# Patient Record
Sex: Female | Born: 2000 | Race: White | Hispanic: No | Marital: Married
Health system: Midwestern US, Community
[De-identification: ages and names within clinical notes are randomized; demographics above are authoritative.]

## PROBLEM LIST (undated history)

## (undated) DIAGNOSIS — Z309 Encounter for contraceptive management, unspecified: Secondary | ICD-10-CM

## (undated) DIAGNOSIS — M79671 Pain in right foot: Principal | ICD-10-CM

## (undated) DIAGNOSIS — G4733 Obstructive sleep apnea (adult) (pediatric): Principal | ICD-10-CM

## (undated) DIAGNOSIS — B2700 Gammaherpesviral mononucleosis without complication: Principal | ICD-10-CM

## (undated) DIAGNOSIS — G8929 Other chronic pain: Secondary | ICD-10-CM

## (undated) DIAGNOSIS — M79672 Pain in left foot: Secondary | ICD-10-CM

## (undated) DIAGNOSIS — L301 Dyshidrosis [pompholyx]: Secondary | ICD-10-CM

---
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
## 2019-09-29 ENCOUNTER — Other Ambulatory Visit: Payer: Self-pay | Admitting: Nurse Practitioner

## 2019-09-29 DIAGNOSIS — N946 Dysmenorrhea, unspecified: Secondary | ICD-10-CM

## 2019-12-19 ENCOUNTER — Other Ambulatory Visit: Payer: Self-pay | Admitting: Nurse Practitioner

## 2019-12-19 DIAGNOSIS — N946 Dysmenorrhea, unspecified: Secondary | ICD-10-CM

## 2019-12-19 MED ORDER — NORETHIN ACE-ETH ESTRAD-FE 1.5-30 MG-MCG PO TABS: ORAL_TABLET | ORAL | 0 refills | Status: DC

## 2019-12-19 NOTE — Telephone Encounter (Signed)
  Prescription Request  12/19/2019  What is the name of the medication or equipment? JUNEL FE 1.5/30 1.5-30 MG-MCG tablet   Have you contacted your pharmacy to request a refill? (if applicable) no-pt states she has lost her birth control and needing it replaced  Which pharmacy would you like this sent to? [facility] in [place]   Patient notified that their request is being sent to the clinical staff for review and that they should receive a response within 2 business days.

## 2019-12-19 NOTE — Telephone Encounter (Signed)
One ref[facility] in [place], appt made for 12/26/19

## 2019-12-26 ENCOUNTER — Encounter: Payer: Self-pay | Admitting: Nurse Practitioner

## 2019-12-26 ENCOUNTER — Telehealth (INDEPENDENT_AMBULATORY_CARE_PROVIDER_SITE_OTHER): Payer: Managed Care, Other (non HMO) | Admitting: Nurse Practitioner

## 2019-12-26 DIAGNOSIS — Z3041 Encounter for surveillance of contraceptive pills: Secondary | ICD-10-CM | POA: Diagnosis not present

## 2019-12-26 MED ORDER — NORETHIN ACE-ETH ESTRAD-FE 1.5-30 MG-MCG PO TABS: ORAL_TABLET | ORAL | 1 refills | Status: DC

## 2019-12-26 NOTE — Progress Notes (Signed)
   Virtual Visit via video Note   Due to COVID-19 pandemic this visit was conducted virtually. This visit type was conducted due to national recommendations for restrictions regarding the COVID-19 Pandemic (e.g. social distancing, sheltering in place) in an effort to limit this patient's exposure and mitigate transmission in our community. All issues noted in this document were discussed and addressed.  A physical exam was not performed with this format.  I connected with  [name]  on 12/26/19 at 11:45 by video and verified that I am speaking with the correct person using two identifiers. Mariah Huffman is currently located at HOME and NO ONE is currently with HER during visit. The provider, Mary-Margaret Daphine Deutscher, FNP is located in their office at time of visit.  I discussed the limitations, risks, security and privacy concerns of performing an evaluation and management service by video  and the availability of in person appointments. I also discussed with the patient that there may be a patient responsible charge related to this service. The patient expressed understanding and agreed to proceed.   History and Present Illness:   Chief Complaint: Contraception   HPI Patient has appointment today needing refill of birth control pills. She has been on them for about 2 years now and is doing well. LMP was 12/08/19 and was normal. She denies any medictaion side effcts.    Review of Systems  Constitutional: Negative for diaphoresis and weight loss.  Eyes: Negative for blurred vision, double vision and pain.  Respiratory: Negative for shortness of breath.   Cardiovascular: Negative for chest pain, palpitations, orthopnea and leg swelling.  Gastrointestinal: Negative for abdominal pain.  Skin: Negative for rash.  Neurological: Negative for dizziness, sensory change, loss of consciousness, weakness and headaches.  Endo/Heme/Allergies: Negative for polydipsia. Does not  bruise/bleed easily.  Psychiatric/Behavioral: Negative for memory loss. The patient does not have insomnia.   All other systems reviewed and are negative.      Observations/Objective: Alert and oriented- answers all questions appropriately No distress    Assessment and Plan: Mariah Huffman in today with chief complaint of Contraception   1. Encounter for birth control pills maintenance Allowed time for questions Will need PAP when turns 20  - norethindrone-ethinyl estradiol-iron (JUNEL FE 1.5/30) 1.5-30 MG-MCG tablet; TAKE 1 TABLET BY MOUTH DAILY. (NEEDS TO BE SEEN BEFORE NEXT REFILL)  Dispense: 84 tablet; Refill: 1    Follow Up Instructions: 1 year    I discussed the assessment and treatment plan with the patient. The patient was provided an opportunity to ask questions and all were answered. The patient agreed with the plan and demonstrated an understanding of the instructions.   The patient was advised to call back or seek an in-person evaluation if the symptoms worsen or if the condition fails to improve as anticipated.  The above assessment and management plan was discussed with the patient. The patient verbalized understanding of and has agreed to the management plan. Patient is aware to call the clinic if symptoms persist or worsen. Patient is aware when to return to the clinic for a follow-up visit. Patient educated on when it is appropriate to go to the emergency department.   Time call ended: 12:00 I provided 15 minutes of face-to-face time during this encounter.    Mary-Margaret Daphine Deutscher, FNP

## 2020-09-14 ENCOUNTER — Other Ambulatory Visit: Payer: Self-pay | Admitting: Nurse Practitioner

## 2020-09-14 DIAGNOSIS — Z3041 Encounter for surveillance of contraceptive pills: Secondary | ICD-10-CM

## 2020-10-08 ENCOUNTER — Telehealth: Payer: Self-pay | Admitting: Nurse Practitioner

## 2020-10-08 NOTE — Telephone Encounter (Signed)
Pt called - immun. Reviewed with pt

## 2020-12-09 ENCOUNTER — Encounter: Payer: Self-pay | Admitting: Nurse Practitioner

## 2020-12-09 ENCOUNTER — Ambulatory Visit (INDEPENDENT_AMBULATORY_CARE_PROVIDER_SITE_OTHER): Payer: Managed Care, Other (non HMO) | Admitting: Nurse Practitioner

## 2020-12-09 DIAGNOSIS — F32A Depression, unspecified: Secondary | ICD-10-CM | POA: Diagnosis not present

## 2020-12-09 DIAGNOSIS — F419 Anxiety disorder, unspecified: Secondary | ICD-10-CM

## 2020-12-09 MED ORDER — CITALOPRAM HYDROBROMIDE 20 MG PO TABS: 20.0000 mg | ORAL_TABLET | Freq: Every day | ORAL | 5 refills | Status: DC

## 2020-12-09 NOTE — Progress Notes (Signed)
Virtual Visit  Note Due to COVID-19 pandemic this visit was conducted virtually. This visit type was conducted due to national recommendations for restrictions regarding the COVID-19 Pandemic (e.g. social distancing, sheltering in place) in an effort to limit this patient's exposure and mitigate transmission in our community. All issues noted in this document were discussed and addressed.  A physical exam was not performed with this format.  I connected with Mariah Huffman on 12/09/20 at 8:27 by telephone and verified that I am speaking with the correct person using two identifiers. Mariah Huffman is currently located at college and no one is currently with her during visit. The provider, Mary-Margaret Daphine Deutscher, FNP is located in their office at time of visit.  I discussed the limitations, risks, security and privacy concerns of performing an evaluation and management service by telephone and the availability of in person appointments. I also discussed with the patient that there may be a patient responsible charge related to this service. The patient expressed understanding and agreed to proceed.   History and Present Illness:  Patient is a Consulting civil engineer at [employer]. She started having anxiety being around crowds and test taking. She has been seeing a counselor for over a year. Now she says she is anxious all the time. Counselor suggested she start on anti anxiety medication. GAD 7 : Generalized Anxiety Score 12/09/2020  Nervous, Anxious, on Edge 3  Control/stop worrying 2  Worry too much - different things 2  Trouble relaxing 2  Restless 3  Easily annoyed or irritable 2  Afraid - awful might happen 1  Total GAD 7 Score 15  Anxiety Difficulty Very difficult    Depression screen [facility] 2/9 12/09/2020 11/20/2017 09/17/2017  Decreased Interest 1 0 0  Down, Depressed, Hopeless 1 0 0  PHQ - 2 Score 2 0 0  Altered sleeping 2 - -  Tired, decreased energy 1 - -  Change in appetite 0 - -   Feeling bad or failure about yourself  2 - -  Trouble concentrating 2 - -  Moving slowly or fidgety/restless 1 - -  Suicidal thoughts 1 - -  PHQ-9 Score 11 - -  Difficult doing work/chores Very difficult - -      Review of Systems  Psychiatric/Behavioral:  Positive for depression. Negative for substance abuse. The patient is nervous/anxious.   All other systems reviewed and are negative.   Observations/Objective: Alert and oriented- answers all questions appropriately No distress   Assessment and Plan: Mariah Huffman in today with chief complaint of Anxiety   1. Anxiety and depression Stress management Eat 3 meals a day Exercise Follow up 6-8 weeks - citalopram (CELEXA) 20 MG tablet; Take 1 tablet (20 mg total) by mouth daily.  Dispense: 30 tablet; Refill: 5     Follow Up Instructions: 6-8 weeks    I discussed the assessment and treatment plan with the patient. The patient was provided an opportunity to ask questions and all were answered. The patient agreed with the plan and demonstrated an understanding of the instructions.   The patient was advised to call back or seek an in-person evaluation if the symptoms worsen or if the condition fails to improve as anticipated.  The above assessment and management plan was discussed with the patient. The patient verbalized understanding of and has agreed to the management plan. Patient is aware to call the clinic if symptoms persist or worsen. Patient is aware when to return to the clinic for a follow-up visit.  Patient educated on when it is appropriate to go to the emergency department.   Time call ended:  8:40  I provided 13 minutes of  non face-to-face time during this encounter.    Mary-Margaret Daphine Deutscher, FNP

## 2020-12-09 NOTE — Patient Instructions (Signed)
Textbook of family medicine (9th ed., pp. 1062-1073). [place], [place]: [employer].">  Stress, Adult Stress is a normal reaction to life events. Stress is what you feel when life demands more than you are used to, or more than you think you can handle. Some stress can be useful, such as studying for a test or meeting a deadline at work. Stress that occurs too often or for too long can cause problems. It can affect your emotional health and interfere with relationships and normal daily activities. Too much stress can weaken your body's defense system (immune system) and increase your risk for physical illness. If you already have a medicalproblem, stress can make it worse. What are the causes? All sorts of life events can cause stress. An event that causes stress for one person may not be stressful for another person. Major life events, whether positive or negative, commonly cause stress. Examples include: Losing a job or starting a new job. Losing a loved one. Moving to a new town or home. Getting married or divorced. Having a baby. Getting injured or sick. Less obvious life events can also cause stress, especially if they occur day after day or in combination with each other. Examples include: Working long hours. Driving in traffic. Caring for children. Being in debt. Being in a difficult relationship. What are the signs or symptoms? Stress can cause emotional symptoms, including: Anxiety. This is feeling worried, afraid, on edge, overwhelmed, or out of control. Anger, including irritation or impatience. Depression. This is feeling sad, down, helpless, or guilty. Trouble focusing, remembering, or making decisions. Stress can cause physical symptoms, including: Aches and pains. These may affect your head, neck, back, stomach, or other areas of your body. Tight muscles or a clenched jaw. Low energy. Trouble sleeping. Stress can cause unhealthy behaviors, including: Eating to feel better  (overeating) or skipping meals. Working too much or putting off tasks. Smoking, drinking alcohol, or using drugs to feel better. How is this diagnosed? Stress is diagnosed through an assessment by your health care provider. He or she may diagnose this condition based on: Your symptoms and any stressful life events. Your medical history. Tests to rule out other causes of your symptoms. Depending on your condition, your health care provider may refer you to aspecialist for further evaluation. How is this treated?  Stress management techniques are the recommended treatment for stress. Medicineis not typically recommended for the treatment of stress. Techniques to reduce your reaction to stressful life events include: Stress identification. Monitor yourself for symptoms of stress and identify what causes stress for you. These skills may help you to avoid or prepare for stressful events. Time management. Set your priorities, keep a calendar of events, and learn to say no. Taking these actions can help you avoid making too many commitments. Techniques for coping with stress include: Rethinking the problem. Try to think realistically about stressful events rather than ignoring them or overreacting. Try to find the positives in a stressful situation rather than focusing on the negatives. Exercise. Physical exercise can release both physical and emotional tension. The key is to find a form of exercise that you enjoy and do it regularly. Relaxation techniques. These relax the body and mind. The key is to find one or more that you enjoy and use the techniques regularly. Examples include: Meditation, deep breathing, or progressive relaxation techniques. Yoga or tai chi. Biofeedback, mindfulness techniques, or journaling. Listening to music, being out in nature, or participating in other hobbies. Practicing a healthy lifestyle.   Eat a balanced diet, drink plenty of water, limit or avoid caffeine, and get  plenty of sleep. Having a strong support network. Spend time with family, friends, or other people you enjoy being around. Express your feelings and talk things over with someone you trust. Counseling or talk therapy with a mental health professional may be helpful if you are havingtrouble managing stress on your own. Follow these instructions at home: Lifestyle  Avoid drugs. Do not use any products that contain nicotine or tobacco, such as cigarettes, e-cigarettes, and chewing tobacco. If you need help quitting, ask your health care provider. Limit alcohol intake to no more than 1 drink a day for nonpregnant women and 2 drinks a day for men. One drink equals 12 oz of beer, 5 oz of wine, or 1 oz of hard liquor Do not use alcohol or drugs to relax. Eat a balanced diet that includes fresh fruits and vegetables, whole grains, lean meats, fish, eggs, and beans, and low-fat dairy. Avoid processed foods and foods high in added fat, sugar, and salt. Exercise at least 30 minutes on 5 or more days each week. Get 7-8 hours of sleep each night.  General instructions  Practice stress management techniques as discussed with your health care provider. Drink enough fluid to keep your urine clear or pale yellow. Take over-the-counter and prescription medicines only as told by your health care provider. Keep all follow-up visits as told by your health care provider. This is important.  Contact a health care provider if: Your symptoms get worse. You have new symptoms. You feel overwhelmed by your problems and can no longer manage them on your own. Get help right away if: You have thoughts of hurting yourself or others. If you ever feel like you may hurt yourself or others, or have thoughts about taking your own life, get help right away. You can go to your nearest emergency department or call: Your local emergency services (911 in the [place] A suicide crisis helpline, such as the [facility] at 4253100524. This is open 24 hours a day. Summary Stress is a normal reaction to life events. It can cause problems if it happens too often or for too long. Practicing stress management techniques is the best way to treat stress. Counseling or talk therapy with a mental health professional may be helpful if you are having trouble managing stress on your own. This information is not intended to replace advice given to you by your health care provider. Make sure you discuss any questions you have with your healthcare provider. Document Revised: 01/23/2020 Document Reviewed: 01/23/2020 Elsevier Patient Education  2022 [employer].

## 2020-12-09 NOTE — Progress Notes (Deleted)
   Subjective:    Patient ID: Mariah Huffman, female    DOB: [birth year], 20 y.o.   MRN: [id number]  HPI    Review of Systems     Objective:   Physical Exam        Assessment & Plan:

## 2020-12-09 NOTE — Addendum Note (Signed)
Addended by: Bennie Pierini on: 12/09/2020 11:28 AM   Modules accepted: Level of Service

## 2021-01-01 ENCOUNTER — Other Ambulatory Visit: Payer: Self-pay | Admitting: Nurse Practitioner

## 2021-01-01 DIAGNOSIS — F419 Anxiety disorder, unspecified: Secondary | ICD-10-CM

## 2021-01-01 DIAGNOSIS — F32A Depression, unspecified: Secondary | ICD-10-CM

## 2021-02-28 ENCOUNTER — Other Ambulatory Visit: Payer: Self-pay | Admitting: Nurse Practitioner

## 2021-02-28 DIAGNOSIS — Z3041 Encounter for surveillance of contraceptive pills: Secondary | ICD-10-CM

## 2021-03-18 ENCOUNTER — Other Ambulatory Visit: Payer: Self-pay

## 2021-03-18 ENCOUNTER — Encounter: Payer: Self-pay | Admitting: Nurse Practitioner

## 2021-03-18 ENCOUNTER — Ambulatory Visit (INDEPENDENT_AMBULATORY_CARE_PROVIDER_SITE_OTHER): Payer: Managed Care, Other (non HMO) | Admitting: Nurse Practitioner

## 2021-03-18 VITALS — BP 128/86 | HR 70 | Temp 98.6°F | Resp 20 | Ht 67.0 in | Wt 253.0 lb

## 2021-03-18 DIAGNOSIS — Z3041 Encounter for surveillance of contraceptive pills: Secondary | ICD-10-CM | POA: Diagnosis not present

## 2021-03-18 DIAGNOSIS — Z23 Encounter for immunization: Secondary | ICD-10-CM | POA: Diagnosis not present

## 2021-03-18 MED ORDER — NORETHIN ACE-ETH ESTRAD-FE 1.5-30 MG-MCG PO TABS: ORAL_TABLET | ORAL | 3 refills | Status: DC

## 2021-03-18 NOTE — Patient Instructions (Signed)
Oral Contraception Information Oral contraceptive pills (OCPs) are medicines taken by mouth to prevent pregnancy. They work by: Preventing the ovaries from releasing eggs. Thickening mucus in the lower part of the uterus (cervix). This prevents sperm from entering the uterus. Thinning the lining of the uterus (endometrium). This prevents a fertilized egg from attaching to the endometrium. OCPs are highly effective when taken exactly as prescribed. However, OCPs do not prevent STIs (sexually transmitted infections). Using condoms while on an OCP can help prevent STIs. What happens before starting OCPs? Before you start taking OCPs: You may have a physical exam, blood test, and Pap test. Your health care provider will make sure you are a good candidate for oral contraception. OCPs are not a good option for certain women, such as: Women who smoke and are older than age 35. Women who have or have had certain conditions, such as: A history of high blood pressure. Deep vein thrombosis. Pulmonary embolism. Stroke. Cardiovascular disease. Peripheral vascular disease. Ask your health care provider about the possible side effects of the OCP you may be prescribed. Be aware that it can take 2-3 months for your body to adjustto changes in hormone levels. Types of oral contraception  Birth control pills contain the hormones estrogen and progestin (synthetic progesterone) or progestin only. The combination pill This type of pill contains estrogen and progestin hormones. Conventional contraception pills come in packs of 21 or 28 pills. Some packs with 28-day pills contain estrogen and progestin for the first 21-24 days. Hormone-free tablets, called placebos, are taken for the final 4-7 days. You should have menstrual bleeding during the time you take the placebos. In packs with 21 tablets, you take no pills for 7 days. Menstrual bleeding occurs during these days. (Some people prefer taking a pill for 28  days to help establish a routine). Extended-interval contraception pills come in packs of 91 pills. The first 84 tablets have both estrogen and progestin. The last 7 pills are placebos. Menstrual bleeding occurs during the placebo days. With this schedule, menstrual bleeding happens once every 3 months. Continuous contraception pills come in packs of 28 pills. All pills in the pack contain estrogen and progestin. With this schedule, regular menstrual bleeding does not happen, but there may be spotting or irregular bleeding. Progestin-only pills This type of pill is often called the mini-pill and contains the progestin hormone only. It comes in packs of 28 pills. In some packs, the last 4 pills are placebos. The pill must be taken at the same time every day. This is very important to prevent pregnancy. Menstrual bleeding may not be regular orpredictable. What are the advantages? Oral contraception provides reliable and continuous contraception if taken as directed. It may treat or decrease symptoms of: Menstrual period cramps. Irregular menstrual cycle or bleeding. Heavy menstrual flow. Abnormal uterine bleeding. Acne, depending on the type of pill. Polycystic ovarian syndrome (POS). Endometriosis. Iron deficiency anemia. Premenstrual symptoms, including severe irritability, depression, or anxiety. It also may: Reduce the risk of endometrial and ovarian cancer. Be used as emergency contraception. Prevent ectopic pregnancies and infections of the fallopian tubes. What can make OCPs less effective? OCPs may be less effective if: You forget to take the pill every day. For progestin-only pills, it is especially important to take the pill at the same time each day. Even taking it 3 hours late can increase the risk of pregnancy. You have a stomach or intestinal disease that reduces your body's ability to absorb the pill. You take   OCPs with other medicines that make OCPs less effective, such as  antibiotics, certain HIV medicines, and some seizure medicines. You take expired OCPs. You forget to restart the pill after 7 days of not taking it. This refers to the packs of 21 pills. What are the side effects and risks? OCPs can sometimes cause side effects, such as: Headache. Depression. Trouble sleeping. Nausea and vomiting. Breast tenderness. Irregular bleeding or spotting during the first several months. Bloating or fluid retention. Increase in blood pressure. Combination pills may slightly increase the risk of: Blood clots. Heart attack. Stroke. Follow these instructions at home: Follow instructions from your health care provider about how to start taking your first cycle of OCPs. Depending on when you start the pill, you may need to use a backup form of birth control, such as condoms, during the first week.Make sure you know what steps to take if you forget to take the pill. Summary Oral contraceptive pills (OCPs) are medicines taken by mouth to prevent pregnancy. They are highly effective when taken exactly as prescribed. OCPs contain a combination of the hormones estrogen and progestin (synthetic progesterone) or progestin only. Before you start taking the pill, you may have a physical exam, blood test, and Pap test. Your health care provider will make sure you are a good candidate for oral contraception. The combination pill may come in a 21-day pack, a 28-day pack, or a 91-day pack. Progestin-only pills come in packs of 28 pills. OCPs can sometimes cause side effects, such as headache, nausea, breast tenderness, or irregular bleeding. This information is not intended to replace advice given to you by your health care provider. Make sure you discuss any questions you have with your healthcare provider. Document Revised: 02/06/2020 Document Reviewed: 01/15/2020 Elsevier Patient Education  2022 [employer] Inc[employer]  

## 2021-03-18 NOTE — Progress Notes (Signed)
   Subjective:    Patient ID: Mariah Huffman, female    DOB: March 29, 2001, 20 y.o.   MRN: [id number]  Chief Complaint: Refill birth control   HPI Patient comes in today for refill of birth control. LMP last week. Normal. No cramping. Not sexually active and has never been.    Review of Systems  Constitutional:  Negative for diaphoresis.  Eyes:  Negative for pain.  Respiratory:  Negative for shortness of breath.   Cardiovascular:  Negative for chest pain, palpitations and leg swelling.  Gastrointestinal:  Negative for abdominal pain.  Endocrine: Negative for polydipsia.  Skin:  Negative for rash.  Neurological:  Negative for dizziness, weakness and headaches.  Hematological:  Does not bruise/bleed easily.  All other systems reviewed and are negative.     Objective:   Physical Exam Vitals and nursing note reviewed.  Constitutional:      General: She is not in acute distress.    Appearance: Normal appearance. She is well-developed.  Neck:     Vascular: No carotid bruit or JVD.  Cardiovascular:     Rate and Rhythm: Normal rate and regular rhythm.     Heart sounds: Normal heart sounds.  Pulmonary:     Effort: Pulmonary effort is normal. No respiratory distress.     Breath sounds: Normal breath sounds. No wheezing or rales.  Chest:     Chest wall: No tenderness.  Abdominal:     General: Bowel sounds are normal. There is no distension or abdominal bruit.     Palpations: Abdomen is soft. There is no hepatomegaly, splenomegaly, mass or pulsatile mass.     Tenderness: There is no abdominal tenderness.  Musculoskeletal:        General: Normal range of motion.     Cervical back: Normal range of motion and neck supple.  Lymphadenopathy:     Cervical: No cervical adenopathy.  Skin:    General: Skin is warm and dry.  Neurological:     Mental Status: She is alert and oriented to person, place, and time.     Deep Tendon Reflexes: Reflexes are normal and symmetric.   Psychiatric:        Behavior: Behavior normal.        Thought Content: Thought content normal.        Judgment: Judgment normal.    BP 128/86   Pulse 70   Temp 98.6 F (37 C) (Temporal)   Resp 20   Ht 5\' 7"  (1.702 m)   Wt 253 lb (114.8 kg)   SpO2 99%   BMI 39.63 kg/m        Assessment & Plan:  Mariah Huffman in today with chief complaint of Refill birth control   1. Encounter for birth control pills maintenance Safe sex discussed Will need PAP in the future Follow up prn - norethindrone-ethinyl estradiol-iron (JUNEL FE 1.5/30) 1.5-30 MG-MCG tablet; TAKE 1 TABLET BY MOUTH DAILY  Dispense: 84 tablet; Refill: 3    The above assessment and management plan was discussed with the patient. The patient verbalized understanding of and has agreed to the management plan. Patient is aware to call the clinic if symptoms persist or worsen. Patient is aware when to return to the clinic for a follow-up visit. Patient educated on when it is appropriate to go to the emergency department.   Mary-Margaret 03-25-1978, FNP

## 2021-05-13 ENCOUNTER — Ambulatory Visit (INDEPENDENT_AMBULATORY_CARE_PROVIDER_SITE_OTHER): Payer: Managed Care, Other (non HMO) | Admitting: Nurse Practitioner

## 2021-05-13 ENCOUNTER — Encounter: Payer: Self-pay | Admitting: Nurse Practitioner

## 2021-05-13 VITALS — BP 129/80 | HR 94 | Temp 99.3°F | Resp 20 | Ht 67.0 in | Wt 270.0 lb

## 2021-05-13 DIAGNOSIS — M79602 Pain in left arm: Secondary | ICD-10-CM | POA: Diagnosis not present

## 2021-05-13 MED ORDER — NAPROXEN 500 MG PO TABS: 500.0000 mg | ORAL_TABLET | Freq: Two times a day (BID) | ORAL | 1 refills | Status: DC

## 2021-05-13 NOTE — Progress Notes (Signed)
° °  Subjective:    Patient ID: Mariah Huffman, female    DOB: January 31, 2001, 20 y.o.   MRN: [id number]   Chief Complaint: left arm pain  HPI  Patient comes in today c/o left arm pain. Pain started about 2-3 weeks ago. It is intermittent and doe snot happen everyday. Usually when starts it will hurt for several hours. Rates pain 3/10. Says pain is just enough to be annoying. Nothing makes it better or worse.    Review of Systems  Musculoskeletal:  Positive for myalgias.      Objective:   Physical Exam Constitutional:      Appearance: Normal appearance. She is obese.  Cardiovascular:     Rate and Rhythm: Normal rate and regular rhythm.     Heart sounds: Normal heart sounds.  Pulmonary:     Breath sounds: Normal breath sounds.  Musculoskeletal:     Comments: FROM of cervical spine without pain FROM of left shoulder without pain No point tenderness Motor strength and sensation distally intact  Skin:    General: Skin is warm.  Neurological:     General: No focal deficit present.     Mental Status: She is alert and oriented to person, place, and time.  Psychiatric:        Mood and Affect: Mood normal.        Behavior: Behavior normal.   BP 129/80    Pulse 94    Temp 99.3 F (37.4 C) (Temporal)    Resp 20    Ht 5\' 7"  (1.702 m)    Wt 270 lb (122.5 kg)    SpO2 97%    BMI 42.29 kg/m         Assessment & Plan:  Mariah Huffman in today with chief complaint of Pain radiating down left arm   1. Left arm pain Moist  heat  Res Rto if not improving - naproxen (NAPROSYN) 500 MG tablet; Take 1 tablet (500 mg total) by mouth 2 (two) times daily with a meal.  Dispense: 60 tablet; Refill: 1    The above assessment and management plan was discussed with the patient. The patient verbalized understanding of and has agreed to the management plan. Patient is aware to call the clinic if symptoms persist or worsen. Patient is aware when to return to the clinic for a follow-up  visit. Patient educated on when it is appropriate to go to the emergency department.   Mary-Margaret , FNP

## 2021-06-30 ENCOUNTER — Other Ambulatory Visit: Payer: Self-pay | Admitting: Nurse Practitioner

## 2021-06-30 DIAGNOSIS — F32A Depression, unspecified: Secondary | ICD-10-CM

## 2021-07-05 ENCOUNTER — Telehealth: Payer: Self-pay | Admitting: Nurse Practitioner

## 2021-07-05 NOTE — Telephone Encounter (Signed)
[facility] for 30 d supply on 06/30/21, please call the office to schedule an appt w/ Mary-Margaret, last OV for this was in July

## 2021-07-05 NOTE — Telephone Encounter (Signed)
°  Prescription Request  07/05/2021  Is this a "Controlled Substance" medicine? no  Have you seen your PCP in the last 2 weeks? no  If YES, route message to pool  -  If NO, patient needs to be scheduled for appointment.  What is the name of the medication or equipment? citalopram (CELEXA) 20 MG tablet  Have you contacted your pharmacy to request a refill? Yes, they said that they have not received    Which pharmacy would you like [facility] sent to[facility]    Patient notified that their request is being sent to the clinical staff for review and that they should receive a response within 2 business days.

## 2021-07-08 ENCOUNTER — Other Ambulatory Visit: Payer: Self-pay | Admitting: Nurse Practitioner

## 2021-07-08 DIAGNOSIS — M79602 Pain in left arm: Secondary | ICD-10-CM

## 2021-08-01 ENCOUNTER — Other Ambulatory Visit: Payer: Self-pay | Admitting: Nurse Practitioner

## 2021-08-01 DIAGNOSIS — F419 Anxiety disorder, unspecified: Secondary | ICD-10-CM

## 2021-08-04 ENCOUNTER — Telehealth (INDEPENDENT_AMBULATORY_CARE_PROVIDER_SITE_OTHER): Payer: Managed Care, Other (non HMO) | Admitting: Nurse Practitioner

## 2021-08-04 ENCOUNTER — Encounter: Payer: Self-pay | Admitting: Nurse Practitioner

## 2021-08-04 DIAGNOSIS — F32A Depression, unspecified: Secondary | ICD-10-CM | POA: Diagnosis not present

## 2021-08-04 DIAGNOSIS — F419 Anxiety disorder, unspecified: Secondary | ICD-10-CM

## 2021-08-04 MED ORDER — CITALOPRAM HYDROBROMIDE 20 MG PO TABS: 20.0000 mg | ORAL_TABLET | Freq: Every day | ORAL | 1 refills | Status: DC

## 2021-08-04 NOTE — Progress Notes (Signed)
? ?Virtual Visit Consent  ? ?Mariah Huffman, you are scheduled for a virtual visit with Mary-Margaret Hassell Done, [place], a [facility] provider, today.   ?  ?Just as with appointments in the office, your consent must be obtained to participate.  Your consent will be active for this visit and any virtual visit you may have with one of our providers in the next 365 days.   ?  ?If you have a MyChart account, a copy of this consent can be sent to you electronically.  All virtual visits are billed to your insurance company just like a traditional visit in the office.   ? ?As this is a virtual visit, video technology does not allow for your provider to perform a traditional examination.  This may limit your provider's ability to fully assess your condition.  If your provider identifies any concerns that need to be evaluated in person or the need to arrange testing (such as labs, EKG, etc.), we will make arrangements to do so.   ?  ?Although advances in technology are sophisticated, we cannot ensure that it will always work on either your end or our end.  If the connection with a video visit is poor, the visit may have to be switched to a telephone visit.  With either a video or telephone visit, we are not always able to ensure that we have a secure connection.    ? ?I need to obtain your verbal consent now.   Are you willing to proceed with your visit today? YES ?  ?Mariah Huffman has provided verbal consent on 08/04/2021 for a virtual visit (video or telephone). ?  ?Mary-Margaret Hassell Done, FNP  ? ?Date: 08/04/2021 11:37 AM ? ? ?Virtual Visit via Video Note  ? ?I, Mary-Margaret Hassell Done, connected with Mariah Huffman ([id number], April 06, 2001) on 08/04/21 at 10:30 AM EDT by a video-enabled telemedicine application and verified that I am speaking with the correct person using two identifiers. ? ?Location: ?Patient: Virtual Visit Location Patient: Home ?Provider: Virtual Visit Location Provider: Mobile ?  ?I discussed  the limitations of evaluation and management by telemedicine and the availability of in person appointments. The patient expressed understanding and agreed to proceed.   ? ?History of Present Illness: ?Mariah Huffman is a 21 y.o. who identifies as a female who was assigned female at birth, and is being seen today for anxiety and depression follow up. ? ?HPI: Patent doing video visit today for follow up of anxiety and depression. She has been on celexa for awhile no and is doing well. ? ?  ?Depression screen [facility] 2/9 08/04/2021 05/13/2021 12/09/2020  ?Decreased Interest 0 0 1  ?Down, Depressed, Hopeless 1 0 1  ?PHQ - 2 Score 1 0 2  ?Altered sleeping 0 0 2  ?Tired, decreased energy 0 0 1  ?Change in appetite 0 0 0  ?Feeling bad or failure about yourself  0 0 2  ?Trouble concentrating 1 0 2  ?Moving slowly or fidgety/restless 0 0 1  ?Suicidal thoughts 0 0 1  ?PHQ-9 Score 2 0 11  ?Difficult doing work/chores Somewhat difficult Not difficult at all Very difficult  ? ?GAD 7 : Generalized Anxiety Score 08/04/2021 05/13/2021 12/09/2020  ?Nervous, Anxious, on Edge 1 0 3  ?Control/stop worrying 0 0 2  ?Worry too much - different things 0 0 2  ?Trouble relaxing 0 0 2  ?Restless 0 0 3  ?Easily annoyed or irritable 1 0 2  ?Afraid - awful might happen 0 0 1  ?  Total GAD 7 Score 2 0 15  ?Anxiety Difficulty Somewhat difficult Not difficult at all Very difficult  ? ? ? ?ROS ? ?Problems: There are no problems to display for this patient. ?  ?Allergies: No Known Allergies ?Medications:  ?Current Outpatient Medications:  ?  citalopram (CELEXA) 20 MG tablet, Take 1 tablet (20 mg total) by mouth daily., Disp: 90 tablet, Rfl: 0 ?  naproxen (NAPROSYN) 500 MG tablet, TAKE 1 TABLET BY MOUTH 2 TIMES DAILY WITH A MEAL., Disp: 60 tablet, Rfl: 1 ?  norethindrone-ethinyl estradiol-iron (JUNEL FE 1.5/30) 1.5-30 MG-MCG tablet, TAKE 1 TABLET BY MOUTH DAILY, Disp: 84 tablet, Rfl: 3 ? ?Observations/Objective: ?Patient is well-developed, well-nourished  in no acute distress.  ?Resting comfortably  at home.  ?Head is normocephalic, atraumatic.  ?No labored breathing.  ?Speech is clear and coherent with logical content.  ?Patient is alert and oriented at baseline.  ? ? ?Assessment and Plan: ? ?Mariah Huffman in today with chief complaint of Anxiety and Depression ? ? ?1. Anxiety and depression ?Stress management ?Exercise 34 x a week ?Rto in  months ?- citalopram (CELEXA) 20 MG tablet; Take 1 tablet (20 mg total) by mouth daily.  Dispense: 90 tablet; Refill: 1 ? ? ?Follow Up Instructions: ?I discussed the assessment and treatment plan with the patient. The patient was provided an opportunity to ask questions and all were answered. The patient agreed with the plan and demonstrated an understanding of the instructions.  A copy of instructions were sent to the patient via MyChart. ? ?The patient was advised to call back or seek an in-person evaluation if the symptoms worsen or if the condition fails to improve as anticipated. ? ?Time:  ?I spent 8 minutes with the patient via telehealth technology discussing the above problems/concerns.   ? ?Mary-Margaret Hassell Done, FNP ? ?

## 2021-08-04 NOTE — Patient Instructions (Signed)
Stress, Adult °Stress is a normal reaction to life events. Stress is what you feel when life demands more than you are used to, or more than you think you can handle. °Some stress can be useful, such as studying for a test or meeting a deadline at work. Stress that occurs too often or for too long can cause problems. Long-lasting stress is called chronic stress. Chronic stress can affect your emotional health and interfere with relationships and normal daily activities. °Too much stress can weaken your body's defense system (immune system) and increase your risk for physical illness. If you already have a medical problem, stress can make it worse. °What are the causes? °All sorts of life events can cause stress. An event that causes stress for one person may not be stressful for someone else. Major life events, whether positive or negative, commonly cause stress. Examples include: °Losing a job or starting a new job. °Losing a loved one. °Moving to a new town or home. °Getting married or divorced. °Having a baby. °Getting injured or sick. °Less obvious life events can also cause stress, especially if they occur day after day or in combination with each other. Examples include: °Working long hours. °Driving in traffic. °Caring for children. °Being in debt. °Being in a difficult relationship. °What are the signs or symptoms? °Stress can cause emotional and physical symptoms and can lead to unhealthy behaviors. These include the following: °Emotional symptoms °Anxiety. This is feeling worried, afraid, on edge, overwhelmed, or out of control. °Anger, including irritation or impatience. °Depression. This is feeling sad, down, helpless, or guilty. °Trouble focusing, remembering, or making decisions. °Physical symptoms °Aches and pains. These may affect your head, neck, back, stomach, or other areas of your body. °Tight muscles or a clenched jaw. °Low energy. °Trouble sleeping. °Unhealthy behaviors °Eating to feel better  (overeating) or skipping meals. °Working too much or putting off tasks. °Smoking, drinking alcohol, or using drugs to feel better. °How is this diagnosed? °A stress disorder is diagnosed through an assessment by your health care provider. A stress disorder may be diagnosed based on: °Your symptoms and any stressful life events. °Your medical history. °Tests to rule out other causes of your symptoms. °Depending on your condition, your health care provider may refer you to a specialist for further evaluation. °How is this treated? °Stress management techniques are the recommended treatment for stress. Medicine is not typically recommended for treating stress. °Techniques to reduce your reaction to stressful life events include: °Identifying stress. Monitor yourself for symptoms of stress and notice what causes stress for you. These skills may help you to avoid or prepare for stressful events. °Managing time. Set your priorities, keep a calendar of events, and learn to say no. These actions can help you avoid taking on too much. °Techniques for dealing with stress include: °Rethinking the problem. Try to think realistically about stressful events rather than ignoring them or overreacting. Try to find the positives in a stressful situation rather than focusing on the negatives. °Exercise. Physical exercise can release both physical and emotional tension. The key is to find a form of exercise that you enjoy and do it regularly. °Relaxation techniques. These relax the body and mind. Find one or more that you enjoy and use the techniques regularly. Examples include: °Meditation, deep breathing, or progressive relaxation techniques. °Yoga or tai chi. °Biofeedback, mindfulness techniques, or journaling. °Listening to music, being in nature, or taking part in other hobbies. °Practicing a healthy lifestyle. Eat a balanced diet,   drink plenty of water, limit or avoid caffeine, and get plenty of sleep. °Having a strong support  network. Spend time with family, friends, or other people you enjoy being around. Express your feelings and talk things over with someone you trust. °Counseling or talk therapy with a mental health provider may help if you are having trouble managing stress by yourself. °Follow these instructions at home: °Lifestyle ° °Avoid drugs. °Do not use any products that contain nicotine or tobacco. These products include cigarettes, chewing tobacco, and vaping devices, such as e-cigarettes. If you need help quitting, ask your health care provider. °If you drink alcohol: °Limit how much you have to: °0-1 drink a day for women who are not pregnant. °0-2 drinks a day for men. °Know how much alcohol is in a drink. In the [place] one drink equals one 12 oz bottle of beer (355 mL), one 5 oz glass of wine (148 mL), or one 1½ oz glass of hard liquor (44 mL). °Do not use alcohol or drugs to relax. °Eat a balanced diet that includes fresh fruits and vegetables, whole grains, lean meats, fish, eggs, beans, and low-fat dairy. Avoid processed foods and foods high in added fat, sugar, and salt. °Exercise at least 30 minutes on 5 or more days each week. °Get 7-8 hours of sleep each night. °General instructions ° °Practice stress management techniques as told by your health care provider. °Drink enough fluid to keep your urine pale yellow. °Take over-the-counter and prescription medicines only as told by your health care provider. °Keep all follow-up visits. This is important. °Contact a health care provider if: °Your symptoms get worse. °You have new symptoms. °You feel overwhelmed by your problems and can no longer manage them by yourself. °Get help right away if: °You have thoughts of hurting yourself or others. °Get help right awayif you feel like you may hurt yourself or others, or have thoughts about taking your own life. Go to your nearest emergency room or: °Call 911. °Call the National Suicide Prevention Lifeline at 1-800-273-8255 or  988. This is open 24 hours a day. °Text the Crisis Text Line at 741741. °Summary °Stress is a normal reaction to life events. It can cause problems if it happens too often or for too long. °Practicing stress management techniques is the best way to treat stress. °Counseling or talk therapy with a mental health provider may help if you are having trouble managing stress by yourself. °This information is not intended to replace advice given to you by your health care provider. Make sure you discuss any questions you have with your health care provider. °Document Revised: 12/16/2020 Document Reviewed: 12/16/2020 °Elsevier Patient Education © 2022 [employer] ° °

## 2021-09-26 ENCOUNTER — Ambulatory Visit (INDEPENDENT_AMBULATORY_CARE_PROVIDER_SITE_OTHER): Payer: Managed Care, Other (non HMO) | Admitting: Nurse Practitioner

## 2021-09-26 ENCOUNTER — Encounter: Payer: Self-pay | Admitting: Nurse Practitioner

## 2021-09-26 VITALS — BP 128/83 | HR 80 | Temp 98.3°F | Resp 20 | Ht 67.0 in | Wt 279.0 lb

## 2021-09-26 DIAGNOSIS — M5441 Lumbago with sciatica, right side: Secondary | ICD-10-CM | POA: Diagnosis not present

## 2021-09-26 DIAGNOSIS — M5442 Lumbago with sciatica, left side: Secondary | ICD-10-CM | POA: Diagnosis not present

## 2021-09-26 MED ORDER — PREDNISONE 10 MG (21) PO TBPK: ORAL_TABLET | ORAL | 0 refills | Status: DC

## 2021-09-26 MED ORDER — CYCLOBENZAPRINE HCL 10 MG PO TABS: 10.0000 mg | ORAL_TABLET | Freq: Three times a day (TID) | ORAL | 1 refills | Status: AC | PRN

## 2021-09-26 NOTE — Progress Notes (Signed)
? ?Subjective:  ? ? Patient ID: Mariah Huffman, female    DOB: 03/24/2001, 21 y.o.   MRN: [id number] ? ? ?Chief Complaint: Back Pain (Tingling in both legs) ? ? ?Back Pain ?This is a new problem. The current episode started 1 to 4 weeks ago. The problem occurs constantly. The problem has been waxing and waning since onset. The pain is present in the lumbar spine. The quality of the pain is described as aching. The pain radiates to the left knee and right knee. The pain is at a severity of 4/10. The pain is moderate. The pain is Worse during the night. The symptoms are aggravated by sitting, bending and lying down. Stiffness is present In the morning. Associated symptoms include leg pain. Pertinent negatives include no numbness or paresthesias.  ? ? ? ? ?Review of Systems  ?Musculoskeletal:  Positive for back pain.  ?Neurological:  Negative for numbness and paresthesias.  ? ?   ?Objective:  ? Physical Exam ?Vitals and nursing note reviewed.  ?Constitutional:   ?   General: She is not in acute distress. ?   Appearance: Normal appearance. She is well-developed.  ?Neck:  ?   Vascular: No carotid bruit or JVD.  ?Cardiovascular:  ?   Rate and Rhythm: Normal rate and regular rhythm.  ?   Heart sounds: Normal heart sounds.  ?Pulmonary:  ?   Effort: Pulmonary effort is normal. No respiratory distress.  ?   Breath sounds: Normal breath sounds. No wheezing or rales.  ?Chest:  ?   Chest wall: No tenderness.  ?Abdominal:  ?   General: Bowel sounds are normal. There is no distension or abdominal bruit.  ?   Palpations: Abdomen is soft. There is no hepatomegaly, splenomegaly, mass or pulsatile mass.  ?   Tenderness: There is no abdominal tenderness.  ?Musculoskeletal:     ?   General: Normal range of motion.  ?   Cervical back: Normal range of motion and neck supple.  ?   Comments: Rises slowly from sitting to standing ?(+) SLR at 45 degrees ?Motor strength and sensation distally intact  ?Lymphadenopathy:  ?   Cervical: No  cervical adenopathy.  ?Skin: ?   General: Skin is warm and dry.  ?Neurological:  ?   Mental Status: She is alert and oriented to person, place, and time.  ?   Deep Tendon Reflexes: Reflexes are normal and symmetric.  ?Psychiatric:     ?   Behavior: Behavior normal.     ?   Thought Content: Thought content normal.     ?   Judgment: Judgment normal.  ? ? ?BP 128/83   Pulse 80   Temp 98.3 ?F (36.8 ?C) (Temporal)   Resp 20   Ht 5\' 7"  (1.702 m)   Wt 279 lb (126.6 kg)   SpO2 99%   BMI 43.70 kg/m?  ? ? ? ?   ?Assessment & Plan:  ? ?Mariah Huffman in today with chief complaint of Back Pain (Tingling in both legs) ? ? ?1. Acute midline low back pain with bilateral sciatica ?Moist heat ?Rest ?Avoid heavy lifting' ?proper body mechanics ? ?- predniSONE (STERAPRED UNI-PAK 21 TAB) 10 MG (21) TBPK tablet; As directed x 6 days  Dispense: 21 tablet; Refill: 0 ?- cyclobenzaprine (FLEXERIL) 10 MG tablet; Take 1 tablet (10 mg total) by mouth 3 (three) times daily as needed for muscle spasms.  Dispense: 30 tablet; Refill: 1 ? ? ? ?The above assessment and management plan  was discussed with the patient. The patient verbalized understanding of and has agreed to the management plan. Patient is aware to call the clinic if symptoms persist or worsen. Patient is aware when to return to the clinic for a follow-up visit. Patient educated on when it is appropriate to go to the emergency department.  ? ?Mary-Margaret Hassell Done, FNP ? ? ?

## 2021-09-26 NOTE — Patient Instructions (Signed)
Acute Back Pain, Adult Acute back pain is sudden and usually short-lived. It is often caused by an injury to the muscles and tissues in the back. The injury may result from: A muscle, tendon, or ligament getting overstretched or torn. Ligaments are tissues that connect bones to each other. Lifting something improperly can cause a back strain. Wear and tear (degeneration) of the spinal disks. Spinal disks are circular tissue that provide cushioning between the bones of the spine (vertebrae). Twisting motions, such as while playing sports or doing yard work. A hit to the back. Arthritis. You may have a physical exam, lab tests, and imaging tests to find the cause of your pain. Acute back pain usually goes away with rest and home care. Follow these instructions at home: Managing pain, stiffness, and swelling Take over-the-counter and prescription medicines only as told by your health care provider. Treatment may include medicines for pain and inflammation that are taken by mouth or applied to the skin, or muscle relaxants. Your health care provider may recommend applying ice during the first 24-48 hours after your pain starts. To do this: Put ice in a plastic bag. Place a towel between your skin and the bag. Leave the ice on for 20 minutes, 2-3 times a day. Remove the ice if your skin turns bright red. This is very important. If you cannot feel pain, heat, or cold, you have a greater risk of damage to the area. If directed, apply heat to the affected area as often as told by your health care provider. Use the heat source that your health care provider recommends, such as a moist heat pack or a heating pad. Place a towel between your skin and the heat source. Leave the heat on for 20-30 minutes. Remove the heat if your skin turns bright red. This is especially important if you are unable to feel pain, heat, or cold. You have a greater risk of getting burned. Activity  Do not stay in bed. Staying in  bed for more than 1-2 days can delay your recovery. Sit up and stand up straight. Avoid leaning forward when you sit or hunching over when you stand. If you work at a desk, sit close to it so you do not need to lean over. Keep your chin tucked in. Keep your neck drawn back, and keep your elbows bent at a 90-degree angle (right angle). Sit high and close to the steering wheel when you drive. Add lower back (lumbar) support to your car seat, if needed. Take short walks on even surfaces as soon as you are able. Try to increase the length of time you walk each day. Do not sit, drive, or stand in one place for more than 30 minutes at a time. Sitting or standing for long periods of time can put stress on your back. Do not drive or use heavy machinery while taking prescription pain medicine. Use proper lifting techniques. When you bend and lift, use positions that put less stress on your back: Bend your knees. Keep the load close to your body. Avoid twisting. Exercise regularly as told by your health care provider. Exercising helps your back heal faster and helps prevent back injuries by keeping muscles strong and flexible. Work with a physical therapist to make a safe exercise program, as recommended by your health care provider. Do any exercises as told by your physical therapist. Lifestyle Maintain a healthy weight. Extra weight puts stress on your back and makes it difficult to have good   posture. Avoid activities or situations that make you feel anxious or stressed. Stress and anxiety increase muscle tension and can make back pain worse. Learn ways to manage anxiety and stress, such as through exercise. General instructions Sleep on a firm mattress in a comfortable position. Try lying on your side with your knees slightly bent. If you lie on your back, put a pillow under your knees. Keep your head and neck in a straight line with your spine (neutral position) when using electronic equipment like  smartphones or pads. To do this: Raise your smartphone or pad to look at it instead of bending your head or neck to look down. Put the smartphone or pad at the level of your face while looking at the screen. Follow your treatment plan as told by your health care provider. This may include: Cognitive or behavioral therapy. Acupuncture or massage therapy. Meditation or yoga. Contact a health care provider if: You have pain that is not relieved with rest or medicine. You have increasing pain going down into your legs or buttocks. Your pain does not improve after 2 weeks. You have pain at night. You lose weight without trying. You have a fever or chills. You develop nausea or vomiting. You develop abdominal pain. Get help right away if: You develop new bowel or bladder control problems. You have unusual weakness or numbness in your arms or legs. You feel faint. These symptoms may represent a serious problem that is an emergency. Do not wait to see if the symptoms will go away. Get medical help right away. Call your local emergency services (911 in the [place] Do not drive yourself to the hospital. Summary Acute back pain is sudden and usually short-lived. Use proper lifting techniques. When you bend and lift, use positions that put less stress on your back. Take over-the-counter and prescription medicines only as told by your health care provider, and apply heat or ice as told. This information is not intended to replace advice given to you by your health care provider. Make sure you discuss any questions you have with your health care provider. Document Revised: 07/30/2020 Document Reviewed: 07/30/2020 Elsevier Patient Education  2023 Elsevier Inc.  

## 2021-10-14 ENCOUNTER — Encounter: Payer: Self-pay | Admitting: Nurse Practitioner

## 2021-10-14 ENCOUNTER — Ambulatory Visit (INDEPENDENT_AMBULATORY_CARE_PROVIDER_SITE_OTHER): Payer: Managed Care, Other (non HMO) | Admitting: Nurse Practitioner

## 2021-10-14 VITALS — BP 125/84 | HR 99 | Temp 98.1°F | Resp 20 | Ht 67.0 in | Wt 288.0 lb

## 2021-10-14 DIAGNOSIS — M545 Low back pain, unspecified: Secondary | ICD-10-CM | POA: Diagnosis not present

## 2021-10-14 MED ORDER — NAPROXEN 500 MG PO TABS: 500.0000 mg | ORAL_TABLET | Freq: Two times a day (BID) | ORAL | 1 refills | Status: AC

## 2021-10-14 NOTE — Patient Instructions (Signed)
Acute Back Pain, Adult Acute back pain is sudden and usually short-lived. It is often caused by an injury to the muscles and tissues in the back. The injury may result from: A muscle, tendon, or ligament getting overstretched or torn. Ligaments are tissues that connect bones to each other. Lifting something improperly can cause a back strain. Wear and tear (degeneration) of the spinal disks. Spinal disks are circular tissue that provide cushioning between the bones of the spine (vertebrae). Twisting motions, such as while playing sports or doing yard work. A hit to the back. Arthritis. You may have a physical exam, lab tests, and imaging tests to find the cause of your pain. Acute back pain usually goes away with rest and home care. Follow these instructions at home: Managing pain, stiffness, and swelling Take over-the-counter and prescription medicines only as told by your health care provider. Treatment may include medicines for pain and inflammation that are taken by mouth or applied to the skin, or muscle relaxants. Your health care provider may recommend applying ice during the first 24-48 hours after your pain starts. To do this: Put ice in a plastic bag. Place a towel between your skin and the bag. Leave the ice on for 20 minutes, 2-3 times a day. Remove the ice if your skin turns bright red. This is very important. If you cannot feel pain, heat, or cold, you have a greater risk of damage to the area. If directed, apply heat to the affected area as often as told by your health care provider. Use the heat source that your health care provider recommends, such as a moist heat pack or a heating pad. Place a towel between your skin and the heat source. Leave the heat on for 20-30 minutes. Remove the heat if your skin turns bright red. This is especially important if you are unable to feel pain, heat, or cold. You have a greater risk of getting burned. Activity  Do not stay in bed. Staying in  bed for more than 1-2 days can delay your recovery. Sit up and stand up straight. Avoid leaning forward when you sit or hunching over when you stand. If you work at a desk, sit close to it so you do not need to lean over. Keep your chin tucked in. Keep your neck drawn back, and keep your elbows bent at a 90-degree angle (right angle). Sit high and close to the steering wheel when you drive. Add lower back (lumbar) support to your car seat, if needed. Take short walks on even surfaces as soon as you are able. Try to increase the length of time you walk each day. Do not sit, drive, or stand in one place for more than 30 minutes at a time. Sitting or standing for long periods of time can put stress on your back. Do not drive or use heavy machinery while taking prescription pain medicine. Use proper lifting techniques. When you bend and lift, use positions that put less stress on your back: Bend your knees. Keep the load close to your body. Avoid twisting. Exercise regularly as told by your health care provider. Exercising helps your back heal faster and helps prevent back injuries by keeping muscles strong and flexible. Work with a physical therapist to make a safe exercise program, as recommended by your health care provider. Do any exercises as told by your physical therapist. Lifestyle Maintain a healthy weight. Extra weight puts stress on your back and makes it difficult to have good   posture. Avoid activities or situations that make you feel anxious or stressed. Stress and anxiety increase muscle tension and can make back pain worse. Learn ways to manage anxiety and stress, such as through exercise. General instructions Sleep on a firm mattress in a comfortable position. Try lying on your side with your knees slightly bent. If you lie on your back, put a pillow under your knees. Keep your head and neck in a straight line with your spine (neutral position) when using electronic equipment like  smartphones or pads. To do this: Raise your smartphone or pad to look at it instead of bending your head or neck to look down. Put the smartphone or pad at the level of your face while looking at the screen. Follow your treatment plan as told by your health care provider. This may include: Cognitive or behavioral therapy. Acupuncture or massage therapy. Meditation or yoga. Contact a health care provider if: You have pain that is not relieved with rest or medicine. You have increasing pain going down into your legs or buttocks. Your pain does not improve after 2 weeks. You have pain at night. You lose weight without trying. You have a fever or chills. You develop nausea or vomiting. You develop abdominal pain. Get help right away if: You develop new bowel or bladder control problems. You have unusual weakness or numbness in your arms or legs. You feel faint. These symptoms may represent a serious problem that is an emergency. Do not wait to see if the symptoms will go away. Get medical help right away. Call your local emergency services (911 in the [place] Do not drive yourself to the hospital. Summary Acute back pain is sudden and usually short-lived. Use proper lifting techniques. When you bend and lift, use positions that put less stress on your back. Take over-the-counter and prescription medicines only as told by your health care provider, and apply heat or ice as told. This information is not intended to replace advice given to you by your health care provider. Make sure you discuss any questions you have with your health care provider. Document Revised: 07/30/2020 Document Reviewed: 07/30/2020 Elsevier Patient Education  2023 Elsevier Inc.  

## 2021-10-14 NOTE — Progress Notes (Signed)
   Subjective:    Patient ID: Mariah Huffman, female    DOB: [birth year], 21 y.o.   MRN: [id number]   Chief Complaint: Back Pain (No better. Took all meds and no change/)   Back Pain Pertinent negatives include no abdominal pain, chest pain, headaches or weakness.  Patient was seen on 09/26/21 c/o low back pain. Back had been hurting her for 4 weeks. She denied injury. She was given a sterid dose pack and flexeril. She has taken all of her meds and is no better. Rates pain today 3/10. Pain is intermittent. Moving make [place] better. Being still makes pain worse. Her job requires a lot of lifting which does not help her back.     Review of Systems  Constitutional:  Negative for diaphoresis.  Eyes:  Negative for pain.  Respiratory:  Negative for shortness of breath.   Cardiovascular:  Negative for chest pain, palpitations and leg swelling.  Gastrointestinal:  Negative for abdominal pain.  Endocrine: Negative for polydipsia.  Musculoskeletal:  Positive for back pain.  Skin:  Negative for rash.  Neurological:  Negative for dizziness, weakness and headaches.  Hematological:  Does not bruise/bleed easily.  All other systems reviewed and are negative.     Objective:   Physical Exam Vitals reviewed.  Constitutional:      Appearance: Normal appearance.  Cardiovascular:     Rate and Rhythm: Normal rate and regular rhythm.     Heart sounds: Normal heart sounds.  Pulmonary:     Effort: Pulmonary effort is normal.     Breath sounds: Normal breath sounds.  Musculoskeletal:     Comments: Ai in lumbar spine with flexion and rotation. (+) SLR bil Motor strength and sensation distally intact  Neurological:     Mental Status: She is alert.    BP 125/84   Pulse 99   Temp 98.1 F (36.7 C) (Temporal)   Resp 20   Ht 5\' 7"  (1.702 m)   Wt 288 lb (130.6 kg)   SpO2 98%   BMI 45.11 kg/m        Assessment & Plan:  Mariah Huffman in today with chief complaint of Back Pain (No  better. Took all meds and no change/)   1. Acute midline low back pain without sciatica Moist heat Rest RTO prn - naproxen (NAPROSYN) 500 MG tablet; Take 1 tablet (500 mg total) by mouth 2 (two) times daily with a meal.  Dispense: 60 tablet; Refill: 1 - MR Lumbar Spine Wo Contrast; Future    The above assessment and management plan was discussed with the patient. The patient verbalized understanding of and has agreed to the management plan. Patient is aware to call the clinic if symptoms persist or worsen. Patient is aware when to return to the clinic for a follow-up visit. Patient educated on when it is appropriate to go to the emergency department.   Mary-Margaret Hassell Done, FNP

## 2021-10-25 ENCOUNTER — Ambulatory Visit (INDEPENDENT_AMBULATORY_CARE_PROVIDER_SITE_OTHER): Payer: Managed Care, Other (non HMO) | Admitting: Family

## 2021-10-25 ENCOUNTER — Encounter: Payer: Self-pay | Admitting: Family

## 2021-10-25 DIAGNOSIS — M5441 Lumbago with sciatica, right side: Secondary | ICD-10-CM | POA: Diagnosis not present

## 2021-10-25 NOTE — Patient Instructions (Signed)

## 2021-10-25 NOTE — Progress Notes (Signed)
   Virtual Visit  Note Due to COVID-19 pandemic this visit was conducted virtually. This visit type was conducted due to national recommendations for restrictions regarding the COVID-19 Pandemic (e.g. social distancing, sheltering in place) in an effort to limit this patient's exposure and mitigate transmission in our community. All issues noted in this document were discussed and addressed.  A physical exam was not performed with this format.  I connected with Mariah Huffman on 10/25/21 at 8:25 AM  by telephone and verified that I am speaking with the correct person using two identifiers. Mariah Huffman is currently located at home and no one is currently with her during visit. The provider, Evelina Dun, FNP is located in their office at time of visit.  I discussed the limitations, risks, security and privacy concerns of performing an evaluation and management service by telephone and the availability of in person appointments. I also discussed with the patient that there may be a patient responsible charge related to this service. The patient expressed understanding and agreed to proceed.   History and Present Illness:  Pt calls the office today with continued back pain. She had a visit on 09/26/21 and was given prednisone and flexeril. She did not see any improvement and was seen again 10/14/21 and given naprosyn.   Denies any injury. 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  Back Pain This is a new problem. The current episode started more than 1 month ago. The problem occurs constantly. The problem has been waxing and waning since onset. The pain is present in the lumbar spine. The quality of the pain is described as aching. The pain radiates to the right thigh. The pain is at a severity of 5/10. The pain is moderate. The symptoms are aggravated by bending, standing and twisting. Associated symptoms include leg pain. Pertinent negatives include no numbness or  weakness. She has tried bed rest, NSAIDs and muscle relaxant for the symptoms. The treatment provided mild relief.     Review of Systems  Musculoskeletal:  Positive for back pain.  Neurological:  Negative for weakness and numbness.    Observations/Objective: No SOB or distress noted   Assessment and Plan: 1. Acute bilateral low back pain with right-sided sciatica Pt will come in to have X-ray PT ordered Continue Naprosyn and Flexeril  ROM exercises encouraged Follow up as needed  - Ambulatory referral to Physical Therapy - DG Lumbar Spine 2-3 Views    I discussed the assessment and treatment plan with the patient. The patient was provided an opportunity to ask questions and all were answered. The patient agreed with the plan and demonstrated an understanding of the instructions.   The patient was advised to call back or seek an in-person evaluation if the symptoms worsen or if the condition fails to improve as anticipated.  The above assessment and management plan was discussed with the patient. The patient verbalized understanding of and has agreed to the management plan. Patient is aware to call the clinic if symptoms persist or worsen. Patient is aware when to return to the clinic for a follow-up visit. Patient educated on when it is appropriate to go to the emergency department.   Time call ended:  8:36 AM  I provided 11 minutes of  non face-to-face time during this encounter.    Evelina Dun, FNP

## 2021-10-28 ENCOUNTER — Other Ambulatory Visit (INDEPENDENT_AMBULATORY_CARE_PROVIDER_SITE_OTHER): Payer: Managed Care, Other (non HMO)

## 2021-10-28 DIAGNOSIS — M5441 Lumbago with sciatica, right side: Secondary | ICD-10-CM

## 2021-11-03 ENCOUNTER — Encounter: Payer: Self-pay | Admitting: Physical Therapy

## 2021-11-03 ENCOUNTER — Ambulatory Visit: Payer: Managed Care, Other (non HMO) | Attending: Family | Admitting: Physical Therapy

## 2021-11-03 DIAGNOSIS — M5441 Lumbago with sciatica, right side: Secondary | ICD-10-CM | POA: Diagnosis not present

## 2021-11-03 DIAGNOSIS — M5459 Other low back pain: Secondary | ICD-10-CM | POA: Diagnosis present

## 2021-11-03 NOTE — Therapy (Addendum)
[facility] [address] [place], [place], [address] Phone: [phone]   Fax:  [phone]  Physical Therapy Evaluation  Patient Details  Name: Mariah Huffman MRN: [id number] Date of Birth: [birth year] Referring Provider (PT): Jannifer Rodney   Encounter Date: 11/03/2021   PT End of Session - 11/03/21 1408     Visit Number 1    Number of Visits 12    Date for PT Re-Evaluation 12/15/21    Authorization Type FOTO.    PT Start Time 0138    PT Stop Time 0225    PT Time Calculation (min) 47 min    Activity Tolerance Patient tolerated treatment well    Behavior During Therapy [facility] for tasks assessed/performed             History reviewed. No pertinent past medical history.  Past Surgical History:  Procedure Laterality Date   TONSILLECTOMY  07/2015    There were no vitals filed for this visit.    Subjective Assessment - 11/03/21 1428     Subjective The patient presents to the clinic today with c/o low back pain with and radiation the length of her right LE anteriorly.  The is has been ongoing and worsening over approximately the last 7 weeks.  Her pain is rated at a 7/10.  She can relate no specific mechanism of injury. Bending and prolonged sitting increases her pain.  Her sleep is disturbed due to pain.    Pertinent History Unremarkable.    Limitations Sitting    How long can you sit comfortably? The longer she sits the worse the pain gets.    Diagnostic tests X-ray.    Patient Stated Goals Get out of pain.    Currently in Pain? Yes    Pain Score 7     Pain Location Back   Right LE.   Pain Orientation Right    Pain Descriptors / Indicators Sharp    Pain Type Acute pain    Pain Radiating Towards Right anterior thigh down length of right LE.    Aggravating Factors  See above.    Pain Relieving Factors "Staing still."                [facility] PT Assessment - 11/03/21 0001       Assessment   Medical Diagnosis  Acute bilateral low back pain with Scitica with right-sided Sciatica.    Referring Provider (PT) Jannifer Rodney    Onset Date/Surgical Date --   ~7 weeks.     Precautions   Precautions None      Restrictions   Weight Bearing Restrictions No      Balance Screen   Has the patient fallen in the past 6 months No    Has the patient had a decrease in activity level because of a fear of falling?  No    Is the patient reluctant to leave their home because of a fear of falling?  No      Home Environment   Living Environment Private residence      Prior Function   Level of Independence Independent      Observation/Other Assessments   Focus on Therapeutic Outcomes (FOTO)  Complete.      Posture/Postural Control   Posture/Postural Control No significant limitations      Deep Tendon Reflexes   DTR Assessment Site Patella;Achilles    Patella DTR 2+    Achilles DTR 2+      ROM / Strength  AROM / PROM / Strength AROM;Strength      AROM   Overall AROM Comments Active lumbar flexion limited by 75% likely due to pain and extension is 20 degrees without significant pain.      Strength   Overall Strength Comments Normal bilateral LE strength.      Palpation   Palpation comment Tender to palpation at L5-S1 and right SIJ.      Special Tests   Other special tests Equal leg lengths. No significant pain reproduction with a right SLR, FABER or Sacral Press test.      Ambulation/Gait   Gait Comments WNL.                        Objective measurements completed on examination: See above findings.       [facility] Adult PT Treatment/Exercise - 11/03/21 0001       Modalities   Modalities Electrical Stimulation;Moist Heat      Moist Heat Therapy   Number Minutes Moist Heat 20 Minutes    Moist Heat Location Lumbar Spine      Electrical Stimulation   Electrical Stimulation Location Right LB/SIJ    Electrical Stimulation Action IFC at 80-150 Hz.    Electrical Stimulation  Parameters 40% scan x 20 minutes.    Electrical Stimulation Goals Pain             LTG:  Ind with an HEP.                     Plan - 11/03/21 1529     Clinical Impression Statement The patient presents to O[facility] with c/o low back pain with radiation into her anterior thigh and down the length of her right LE.  This has been ongoing and worsening over the last 7 weeks.  She was found to be very palpably tender with overpressure at L5-S1 and her right SIJ.  Prolonged sitting and bending increases her pain.  Her sleep is disturbed by pain.  X-ray to her lumbar spine is normal.  She is to have an MRI.  Special testing is negative.  Her LE DTR's are normal as is her strength.  Patient will benefit from skilled physical therapy intervention to address pain and deficits.    Examination-Activity Limitations Other;Sit;Sleep    Examination-Participation Restrictions Other    Stability/Clinical Decision Making Evolving/Moderate complexity    Clinical Decision Making Low    Rehab Potential Excellent    PT Frequency 2x / week    PT Duration 6 weeks    PT Treatment/Interventions ADLs/Self Care Home Management;Cryotherapy;Electrical Stimulation;Ultrasound;Traction;Moist Heat;Therapeutic activities;Therapeutic exercise;Patient/family education;Manual techniques;Passive range of motion;Dry needling    PT Next Visit Plan Combo e'stim/US; STW/M, core exercise progression.    Consulted and Agree with Plan of Care Patient             Patient will benefit from skilled therapeutic intervention in order to improve the following deficits and impairments:  Decreased activity tolerance, Decreased range of motion, Pain  Visit Diagnosis: Other low back pain - Plan: PT plan of care cert/re-cert     Problem List Patient Active Problem List   Diagnosis Date Noted   Anxiety and depression 08/04/2021   Rationale for Evaluation and Treatment Rehabilitation.  Jennessa Trigo, [place], PT 11/03/2021,  3:47 PM  [facility] [address] [place], [place], [address] Phone: [phone]   Fax:  [phone]  Name: Mariah Huffman MRN: [id number] Date of Birth:  05/27/2000   

## 2021-11-04 ENCOUNTER — Telehealth: Payer: Self-pay | Admitting: Nurse Practitioner

## 2021-11-04 DIAGNOSIS — M5441 Lumbago with sciatica, right side: Secondary | ICD-10-CM

## 2021-11-09 ENCOUNTER — Ambulatory Visit: Payer: Managed Care, Other (non HMO) | Admitting: Physical Therapy

## 2021-11-09 DIAGNOSIS — M5459 Other low back pain: Secondary | ICD-10-CM

## 2021-11-09 NOTE — Therapy (Addendum)
[facility] [address] [place], [place], [address] Phone: [phone]   Fax:  [phone]  Physical Therapy Treatment  Patient Details  Name: Mariah Huffman MRN: [id number] Date of Birth: Mar 04, 2001 Referring Provider (PT): Jannifer Rodney   Encounter Date: 11/09/2021   PT End of Session - 11/09/21 1503     Visit Number 2    Number of Visits 12    Date for PT Re-Evaluation 12/15/21    Authorization Type FOTO.    PT Start Time 0147    PT Stop Time 0240    PT Time Calculation (min) 53 min    Activity Tolerance Patient tolerated treatment well    Behavior During Therapy [facility] for tasks assessed/performed             No past medical history on file.  Past Surgical History:  Procedure Laterality Date   TONSILLECTOMY  07/2015    There were no vitals filed for this visit.   Subjective Assessment - 11/09/21 1503     Subjective Patient felt good after last tretament.    Pertinent History Unremarkable.    Limitations Sitting    How long can you sit comfortably? The longer she sits the worse the pain gets.    Diagnostic tests X-ray.    Patient Stated Goals Get out of pain.    Currently in Pain? Yes    Pain Score 2     Pain Location Back    Pain Orientation Right    Pain Descriptors / Indicators Sharp    Pain Type Acute pain                               [facility] Adult PT Treatment/Exercise - 11/09/21 0001       Modalities   Modalities Electrical Stimulation;Moist Heat;Ultrasound      Moist Heat Therapy   Number Minutes Moist Heat 20 Minutes    Moist Heat Location Lumbar Spine      Electrical Stimulation   Electrical Stimulation Location RT LB/SIJ.    Electrical Stimulation Action IFC at 80-150 Hz.    Electrical Stimulation Parameters 40% scan x 20 minutes.    Electrical Stimulation Goals Pain      Ultrasound   Ultrasound Location Right low back/SIJ region.    Ultrasound Parameters Combo  e'stim/US at 1.50 W/CM2 x 7 minutes.    Ultrasound Goals Pain      Manual Therapy   Manual Therapy Soft tissue mobilization    Soft tissue mobilization STW/M x 7 minutes to patient's L5-S1 and SIJ region.           Nustep level 3 x 10 minutes.                      Plan - 11/09/21 1507     Clinical Impression Statement Patient responding well to treatments feeling good after her first visit and no pain complaints after today's treatment.  Normal modality reponse following removal of modality.    Examination-Activity Limitations Other;Sit;Sleep    Examination-Participation Restrictions Other    Stability/Clinical Decision Making Evolving/Moderate complexity    Rehab Potential Excellent    PT Frequency 2x / week    PT Duration 6 weeks    PT Treatment/Interventions ADLs/Self Care Home Management;Cryotherapy;Electrical Stimulation;Ultrasound;Traction;Moist Heat;Therapeutic activities;Therapeutic exercise;Patient/family education;Manual techniques;Passive range of motion;Dry needling    PT Next Visit Plan Combo e'stim/US; STW/M, core exercise progression.  Consulted and Agree with Plan of Care Patient             Patient will benefit from skilled therapeutic intervention in order to improve the following deficits and impairments:  Decreased activity tolerance, Decreased range of motion, Pain  Visit Diagnosis: Other low back pain     Problem List Patient Active Problem List   Diagnosis Date Noted   Anxiety and depression 08/04/2021   Rationale for Evaluation and Treatment Rehabilitation.  Miles Leyda, [place], PT 11/09/2021, 3:10 PM  [facility] [address] [place], [place], [address] Phone: [phone]   Fax:  [phone]  Name: Mariah Huffman MRN: [id number] Date of Birth: 09-21-2000

## 2021-11-09 NOTE — Telephone Encounter (Signed)
Pt was seen on 09/26/2021 and her referral for MRI was submitted too early. Pt says that this week will be 6 weeks and needs it resubmitted to [facility] address: [address] [facility] [address] [phone]  NPI [id number], Per insurance Please submit clinicals

## 2021-11-16 ENCOUNTER — Ambulatory Visit: Payer: Managed Care, Other (non HMO) | Admitting: Physical Therapy

## 2021-11-16 DIAGNOSIS — M5459 Other low back pain: Secondary | ICD-10-CM

## 2021-11-16 NOTE — Therapy (Signed)
[facility] [address] [place], [place], [address] Phone: [phone]   Fax:  [phone]  Physical Therapy Treatment  Patient Details  Name: Mariah Huffman MRN: [id number] Date of Birth: Nov 09, 2000 Referring Provider (PT): Jannifer Rodney   Encounter Date: 11/16/2021   PT End of Session - 11/16/21 1446     Visit Number 3    Number of Visits 12    Date for PT Re-Evaluation 12/15/21    Authorization Type FOTO.    PT Start Time [id number]    PT Stop Time 0240    PT Time Calculation (min) 54 min    Activity Tolerance Patient tolerated treatment well    Behavior During Therapy [facility] for tasks assessed/performed             No past medical history on file.  Past Surgical History:  Procedure Laterality Date   TONSILLECTOMY  07/2015    There were no vitals filed for this visit.   Subjective Assessment - 11/16/21 1446     Subjective Back pain at a 3.  Had to work yesterday and stand in one place for 4 hours.    Pertinent History Unremarkable.    Limitations Sitting    How long can you sit comfortably? The longer she sits the worse the pain gets.    Diagnostic tests X-ray.    Patient Stated Goals Get out of pain.    Currently in Pain? Yes    Pain Score 3     Pain Location Back    Pain Orientation Mid    Pain Descriptors / Indicators Throbbing    Pain Type Acute pain                               [facility] Adult PT Treatment/Exercise - 11/16/21 0001       Exercises   Exercises Knee/Hip      Knee/Hip Exercises: Aerobic   Nustep Level 3 x 15 minutes.      Moist Heat Therapy   Number Minutes Moist Heat 20 Minutes    Moist Heat Location Lumbar Spine      Electrical Stimulation   Electrical Stimulation Location Lower lumbar.    Electrical Stimulation Action IFC at 80-150 Hz.    Electrical Stimulation Parameters 40% scan x 20 minutes.    Electrical Stimulation Goals Pain      Ultrasound   Ultrasound  Location L5-S1 region.    Ultrasound Parameters Combo e'stim/US at 1.50 W/CM2 x 8 minutes.    Ultrasound Goals Pain                                 Plan - 11/16/21 1509     Clinical Impression Statement Patient feeling a throbbing pain over her L5-S1 region after working a workshift yesterday that required her to stand in one place for 4 hours.  She tolerated treatment well today but reported no reduction in pain.    Examination-Activity Limitations Other;Sit;Sleep    Examination-Participation Restrictions Other    Stability/Clinical Decision Making Evolving/Moderate complexity    Rehab Potential Excellent    PT Frequency 2x / week    PT Duration 6 weeks    PT Treatment/Interventions ADLs/Self Care Home Management;Cryotherapy;Electrical Stimulation;Ultrasound;Traction;Moist Heat;Therapeutic activities;Therapeutic exercise;Patient/family education;Manual techniques;Passive range of motion;Dry needling    PT Next Visit Plan Combo e'stim/US; STW/M, core exercise progression.  Consulted and Agree with Plan of Care Patient             Patient will benefit from skilled therapeutic intervention in order to improve the following deficits and impairments:  Decreased activity tolerance, Decreased range of motion, Pain  Visit Diagnosis: Other low back pain     Problem List Patient Active Problem List   Diagnosis Date Noted   Anxiety and depression 08/04/2021   Rationale for Evaluation and Treatment Rehabilitation.  Debbe Crumble, [place], PT 11/16/2021, 3:11 PM  [facility] [address] [place], [place], [address] Phone: [phone]   Fax:  [phone]  Name: Mariah Huffman MRN: [id number] Date of Birth: 06-13-2000

## 2021-11-23 ENCOUNTER — Ambulatory Visit: Payer: Managed Care, Other (non HMO) | Attending: Family | Admitting: *Deleted

## 2021-11-23 DIAGNOSIS — M5459 Other low back pain: Secondary | ICD-10-CM | POA: Insufficient documentation

## 2021-11-23 NOTE — Therapy (Signed)
[facility] [place], [place], [address] Phone: [phone]   Fax:  [phone]  Physical Therapy Treatment  Patient Details  Name: Mariah Huffman MRN: [id number] Date of Birth: 10/15/2000 Referring Provider (PT): Evelina Dun   Encounter Date: 11/23/2021   PT End of Session - 11/23/21 0933     Visit Number 4    Number of Visits 12    Date for PT Re-Evaluation 12/15/21    Authorization Type FOTO.    PT Start Time 0900    PT Stop Time 0950    PT Time Calculation (min) 50 min             No past medical history on file.  Past Surgical History:  Procedure Laterality Date   TONSILLECTOMY  07/2015    There were no vitals filed for this visit.   Subjective Assessment - 11/23/21 0917     Subjective Back pain at a 3/10. Increased LBP while getting dressed. DC today    Pertinent History Unremarkable.    Limitations Sitting    How long can you sit comfortably? The longer she sits the worse the pain gets.    Diagnostic tests X-ray.    Patient Stated Goals Get out of pain.    Currently in Pain? Yes    Pain Score 4     Pain Location Back    Pain Orientation Mid    Pain Descriptors / Indicators Throbbing    Pain Type Acute pain                               [facility] Adult PT Treatment/Exercise - 11/23/21 0001       Self-Care   Self-Care ADL's;Lifting    Lifting AB bracing and hinge bending practiced with ADL's      Exercises   Exercises Lumbar      Lumbar Exercises: Supine   Ab Set 10 reps;5 seconds    Dead Bug 10 reps;5 seconds    Bridge 10 reps;5 seconds      Knee/Hip Exercises: Aerobic   Nustep Level 3 x 14 minutes.                                 Plan - 11/23/21 1311     Clinical Impression Statement Pt arrived today doing fairly well with 3-4/10 LBP, but reports aggravating her LB getting dressed and was a 6/10. Rx focused on AB bracing and hinge  hending withADL's to decrease pain triggers/ flare-ups. Core activation and stabilization exs also performed and handout for HEP was given. FOTO today 28%. Improved from 54%    Examination-Activity Limitations Other;Sit;Sleep    Examination-Participation Restrictions Other    Stability/Clinical Decision Making Evolving/Moderate complexity    Rehab Potential Excellent    PT Frequency 2x / week    PT Duration 6 weeks    PT Treatment/Interventions ADLs/Self Care Home Management;Cryotherapy;Electrical Stimulation;Ultrasound;Traction;Moist Heat;Therapeutic activities;Therapeutic exercise;Patient/family education;Manual techniques;Passive range of motion;Dry needling    PT Next Visit Plan DC to HEP    Consulted and Agree with Plan of Care Patient             Patient will benefit from skilled therapeutic intervention in order to improve the following deficits and impairments:     Visit Diagnosis: Other low back pain     Problem List Patient Active Problem List  Diagnosis Date Noted   Anxiety and depression 08/04/2021    Mariah Huffman,CHRIS, PTA 11/23/2021, 1:19 PM  [facility] [address] [place], [place], [address] Phone: [phone]   Fax:  [phone]  Name: Mariah Huffman MRN: [id number] Date of Birth: 2000/05/30   PHYSICAL THERAPY DISCHARGE SUMMARY  Visits from Start of Care: 4.  Current functional level related to goals / functional outcomes: See above.   Remaining deficits: Continued low back pain.  Patient not able to return to PT as she is moving.   Education / Equipment: HEP....   Patient agrees to discharge. Patient goals were not met. Patient is being discharged due to   .     Mali Applegate MPT

## 2022-02-12 ENCOUNTER — Other Ambulatory Visit: Payer: Self-pay | Admitting: Nurse Practitioner

## 2022-02-12 DIAGNOSIS — Z3041 Encounter for surveillance of contraceptive pills: Secondary | ICD-10-CM

## 2022-02-28 ENCOUNTER — Inpatient Hospital Stay: Admit: 2022-02-28 | Payer: PRIVATE HEALTH INSURANCE | Primary: Family

## 2022-02-28 ENCOUNTER — Ambulatory Visit: Admit: 2022-02-28 | Discharge: 2022-02-28 | Payer: PRIVATE HEALTH INSURANCE | Attending: Family | Primary: Family

## 2022-02-28 DIAGNOSIS — Z30011 Encounter for initial prescription of contraceptive pills: Secondary | ICD-10-CM

## 2022-02-28 DIAGNOSIS — Z13228 Encounter for screening for other metabolic disorders: Secondary | ICD-10-CM

## 2022-02-28 LAB — CBC WITH AUTO DIFFERENTIAL
Absolute Immature Granulocyte: 0 10*3/uL (ref 0.00–0.04)
Basophils %: 0 % (ref 0–2)
Basophils Absolute: 0 10*3/uL (ref 0.0–0.1)
Eosinophils %: 2 % (ref 0–5)
Eosinophils Absolute: 0.2 10*3/uL (ref 0.0–0.4)
Hematocrit: 44.1 % (ref 35.0–45.0)
Hemoglobin: 14.3 g/dL (ref 12.0–16.0)
Immature Granulocytes: 0 % (ref 0.0–0.5)
Lymphocytes %: 34 % (ref 21–52)
Lymphocytes Absolute: 2.3 10*3/uL (ref 0.9–3.6)
MCH: 29.7 PG (ref 24.0–34.0)
MCHC: 32.4 g/dL (ref 31.0–37.0)
MCV: 91.5 FL (ref 78.0–100.0)
MPV: 10.6 FL (ref 9.2–11.8)
Monocytes %: 6 % (ref 3–10)
Monocytes Absolute: 0.4 10*3/uL (ref 0.05–1.2)
Neutrophils %: 57 % (ref 40–73)
Neutrophils Absolute: 3.8 10*3/uL (ref 1.8–8.0)
Nucleated RBCs: 0 PER 100 WBC
Platelets: 256 10*3/uL (ref 135–420)
RBC: 4.82 M/uL (ref 4.20–5.30)
RDW: 12 % (ref 11.6–14.5)
WBC: 6.7 10*3/uL (ref 4.6–13.2)
nRBC: 0 10*3/uL (ref 0.00–0.01)

## 2022-02-28 LAB — LIPID PANEL
Chol/HDL Ratio: 3.8 (ref 0–5.0)
Cholesterol, Total: 238 MG/DL — ABNORMAL HIGH (ref <200)
HDL: 63 MG/DL — ABNORMAL HIGH (ref 40–60)
LDL Calculated: 144.8 MG/DL — ABNORMAL HIGH (ref 0–100)
Triglycerides: 151 MG/DL — ABNORMAL HIGH (ref <150)
VLDL Cholesterol Calculated: 30.2 MG/DL

## 2022-02-28 LAB — URINALYSIS WITH MICROSCOPIC
Bilirubin Urine: NEGATIVE
Blood, Urine: NEGATIVE
Glucose, UA: NEGATIVE mg/dL
Ketones, Urine: NEGATIVE mg/dL
Leukocyte Esterase, Urine: NEGATIVE
Nitrite, Urine: NEGATIVE
Protein, UA: NEGATIVE mg/dL
RBC, UA: 0 /hpf (ref 0–5)
Specific Gravity, UA: 1.015 (ref 1.005–1.030)
Urobilinogen, Urine: 0.2 EU/dL (ref 0.2–1.0)
WBC, UA: 0 /hpf (ref 0–4)
pH, Urine: 5.5 (ref 5.0–8.0)

## 2022-02-28 LAB — COMPREHENSIVE METABOLIC PANEL
ALT: 20 U/L (ref 13–56)
AST: 14 U/L (ref 10–38)
Albumin/Globulin Ratio: 0.9 (ref 0.8–1.7)
Albumin: 3.6 g/dL (ref 3.4–5.0)
Alk Phosphatase: 87 U/L (ref 45–117)
Anion Gap: 3 mmol/L (ref 3.0–18)
BUN: 11 MG/DL (ref 7.0–18)
Bun/Cre Ratio: 15 (ref 12–20)
CO2: 28 mmol/L (ref 21–32)
Calcium: 9 MG/DL (ref 8.5–10.1)
Chloride: 107 mmol/L (ref 100–111)
Creatinine: 0.75 MG/DL (ref 0.6–1.3)
Est, Glom Filt Rate: >60 mL/min/{1.73_m2} (ref >60)
Globulin: 3.9 g/dL (ref 2.0–4.0)
Glucose: 85 mg/dL (ref 74–99)
Potassium: 4.1 mmol/L (ref 3.5–5.5)
Sodium: 138 mmol/L (ref 136–145)
Total Bilirubin: 0.4 MG/DL (ref 0.2–1.0)
Total Protein: 7.5 g/dL (ref 6.4–8.2)

## 2022-02-28 LAB — TSH: TSH, 3RD GENERATION: 2.49 u[IU]/mL (ref 0.36–3.74)

## 2022-02-28 LAB — HEMOGLOBIN A1C
Hemoglobin A1C: 4.7 % (ref 4.2–5.6)
eAG: 88 mg/dL

## 2022-02-28 NOTE — Progress Notes (Signed)
Melanie Harvey (DOB:  28-Apr-2001) is a 21 y.o. female, here for evaluation of the following medical concerns:  Chief Complaint   Patient presents with    Other     Feet cramping and swelling after standing at work all day.     Medication Refill     Junel and celexa          ASSESSMENT/PLAN:  1. Encounter to establish care with new doctor      2. Encounter for initial prescription of contraceptive pills    - AMB POC URINE PREGNANCY TEST, VISUAL COLOR COMPARISON    3. Encounter for screening for metabolic disorder    - CBC with Auto Differential; Future  - Comprehensive Metabolic Panel; Future  - Hemoglobin A1C; Future  - Lipid Panel; Future  - TSH; Future  - Urinalysis with Microscopic; Future  - Hepatitis C Antibody; Future    4. Encounter for hepatitis C screening test for low risk patient    - Hepatitis C Antibody; Future      Return in about 1 month (around 03/31/2022) for [place] with pap.        HPI  New patient here to establish care. She moved from [place], [place] 3 months ago.     She is on Junel for [facility] x 5 years, never had pap. She denies ha, migraines,abdominal pains. Taking for menstrual pain. relief    Celexa- depression and anxiety, onset age 58, started Celexa 1 year ago, with psychology.     Obesity- needs diet and exercise changes.     Vaccines- flu, covid      Review of Systems   Constitutional:  Negative for diaphoresis and fatigue.   Respiratory:  Negative for cough, chest tightness and shortness of breath.    Cardiovascular:  Negative for chest pain, palpitations and leg swelling.   Gastrointestinal:  Negative for abdominal pain, nausea and vomiting.   Neurological:  Negative for dizziness, weakness and headaches.       Prior to Visit Medications    Medication Sig Taking? Authorizing Provider   JUNEL FE 1.5/30 1.5-30 MG-MCG tablet Take 1 tablet by mouth daily Yes [provider]   citalopram (CELEXA) 20 MG tablet Take 1 tablet by mouth daily Yes [provider]         Social History     Tobacco Use    Smoking status: Never     Passive exposure: Never    Smokeless tobacco: Never   Substance Use Topics    Alcohol use: Never        BP 127/84 (Site: Left Upper Arm, Position: Sitting, Cuff Size: Large Adult) Comment (Cuff Size): Long  Pulse 89   Temp 97.2 F (36.2 C) (Temporal)   Resp 20   Ht 5\' 9"  (1.753 m)   Wt 285 lb 3.2 oz (129.4 kg)   LMP 02/06/2022   SpO2 97%   Breastfeeding No   BMI 42.12 kg/m   Estimated body mass index is 42.12 kg/m as calculated from the following:    Height as of this encounter: 5\' 9"  (1.753 m).    Weight as of this encounter: 285 lb 3.2 oz (129.4 kg).  History reviewed. No pertinent past medical history.  Past Surgical History:   Procedure Laterality Date    APPENDECTOMY  2021    TONSILLECTOMY  2017     History reviewed. No pertinent family history.  Father- Type 2 DM. Kidney cancer- removed  Mother- planned hysterectomy  due to masses??      No results found for: "WBC", "HGB", "HCT", "MCV", "PLT", "LABLYMP", "MID", "GRAN", "LYMPHOPCT", "MIDPERCENT", "GRANULOCYTES", "RBC", "MCH", "MCHC", "RDW"  No results found for: "NA", "K", "CL", "CO2", "BUN", "CREATININE", "GLUCOSE", "CALCIUM", "PROT", "LABALBU", "BILITOT", "ALKPHOS", "AST", "ALT", "LABGLOM", "GFRAA", "AGRATIO", "GLOB"  No results found for: "TSH", "TSHREFLEX", "TSHFT4", "TSHELE", "TSH3GEN", "TSHHS"  No results found for: "LABA1C"  No results found for: "CHOL"  No results found for: "TRIG"  No results found for: "HDL"  No results found for: "LDLCHOLESTEROL", "LDLCALC"  No results found for: "LABVLDL", "VLDL"  No results found for: "CHOLHDLRATIO"      Physical Exam  Constitutional:       General: She is not in acute distress.     Appearance: Normal appearance.   HENT:      Head: Normocephalic and atraumatic.   Pulmonary:      Effort: Pulmonary effort is normal.   Neurological:      General: No focal deficit present.      Mental Status: She is alert and oriented to person, place, and time.    Psychiatric:         Mood and Affect: Mood normal.         Behavior: Behavior normal.         Thought Content: Thought content normal.         Judgment: Judgment normal.             An electronic signature was used to authenticate this note.    --Bayard Beaver, APRN - NP on 02/28/2022 at 9:31 AM

## 2022-02-28 NOTE — Progress Notes (Signed)
1. "Have you been to the ER, urgent care clinic since your last visit?  Hospitalized since your last visit?" No    2. "Have you seen or consulted any other health care providers outside of the [facility] since your last visit?" No     3. For patients aged 22-75: Has the patient had a colonoscopy / FIT/ Cologuard? NA - based on age      If the patient is female:    4. For patients aged 40-74: Has the patient had a mammogram within the past 2 years? NA - based on age or sex      5. For patients aged 21-65: Has the patient had a pap smear? No

## 2022-03-01 LAB — HEPATITIS C ANTIBODY
Hep C Ab Interp: NEGATIVE
Hepatitis C Ab: 0.12 Index (ref <0.80)

## 2022-03-03 ENCOUNTER — Other Ambulatory Visit: Payer: Self-pay | Admitting: Nurse Practitioner

## 2022-03-03 DIAGNOSIS — Z3041 Encounter for surveillance of contraceptive pills: Secondary | ICD-10-CM

## 2022-03-03 MED ORDER — JUNEL FE 1.5/30 1.5-30 MG-MCG PO TABS
PACK | Freq: Every day | ORAL | 0 refills | Status: DC
Start: 2022-03-03 — End: 2022-04-04

## 2022-03-03 NOTE — Telephone Encounter (Signed)
From: Susa Loffler  To: Adele Barthel Lassiter  Sent: 03/02/2022 9:26 PM EDT  Subject: Lenda Kelp    Hi! I have my physical/pap scheduled with you on 11/14. However I forgot to mention that I only have 1 week left of my birth control (placebo week). Is there any way you could rx 1 pack to get me to the appointment without skipping a month?

## 2022-04-04 ENCOUNTER — Ambulatory Visit: Admit: 2022-04-04 | Discharge: 2022-04-13 | Payer: PRIVATE HEALTH INSURANCE | Attending: Family | Primary: Family

## 2022-04-04 ENCOUNTER — Inpatient Hospital Stay: Admit: 2022-04-06 | Payer: PRIVATE HEALTH INSURANCE | Primary: Family

## 2022-04-04 DIAGNOSIS — Z124 Encounter for screening for malignant neoplasm of cervix: Secondary | ICD-10-CM

## 2022-04-04 DIAGNOSIS — Z0000 Encounter for general adult medical examination without abnormal findings: Secondary | ICD-10-CM

## 2022-04-04 MED ORDER — JUNEL FE 1.5/30 1.5-30 MG-MCG PO TABS
1.5-30- MG-MCG | PACK | Freq: Every day | ORAL | 4 refills | Status: AC
Start: 2022-04-04 — End: 2022-04-19

## 2022-04-04 MED ORDER — FLUCONAZOLE 150 MG PO TABS
150 MG | ORAL_TABLET | Freq: Once | ORAL | 0 refills | Status: AC
Start: 2022-04-04 — End: 2022-04-04

## 2022-04-04 MED ORDER — MONISTAT 1 DAY OR NIGHT 1200 & 2 MG & % VA KIT
1200 & 2 MG & % | PACK | VAGINAL | 0 refills | Status: AC
Start: 2022-04-04 — End: 2022-04-11

## 2022-04-04 NOTE — Progress Notes (Unsigned)
1. "Have you been to the ER, urgent care clinic since your last visit?  Hospitalized since your last visit?" No    2. "Have you seen or consulted any other health care providers outside of the [facility] since your last visit?" No     If the patient is female:    3. For patients aged 21-65: Has the patient had a pap smear? No

## 2022-04-04 NOTE — Patient Instructions (Signed)
Vaccine Information Statement    Tdap (Tetanus, Diphtheria, Pertussis) Vaccine: What You Need to Know     Many vaccine information statements are available in Spanish and other languages. See AbsolutelyGenuine.com.br.  Hojas de informacin sobre vacunas estn disponibles en espaol y en muchos otros idiomas. Visite AbsolutelyGenuine.com.br.    1. Why get vaccinated?    Tdap vaccine can prevent tetanus, diphtheria, and pertussis.    Diphtheria and pertussis spread from person to person. Tetanus enters the body through cuts or wounds.    o TETANUS (T) causes painful stiffening of the muscles. Tetanus can lead to serious health problems, including being unable to open the mouth, having trouble swallowing and breathing, or death.     o DIPHTHERIA (D) can lead to difficulty breathing, heart failure, paralysis, or death.     o PERTUSSIS (aP), also known as "whooping cough," can cause uncontrollable, violent coughing that makes it hard to breathe, eat, or drink. Pertussis can be extremely serious especially in babies and young children, causing pneumonia, convulsions, brain damage, or death.  In teens and adults, it can cause weight loss, loss of bladder control, passing out, and rib fractures from severe coughing.      2. Tdap vaccine     Tdap is only for children 7 years and older, adolescents, and adults.     Adolescents should receive a single dose of Tdap, preferably at age 35 or 68 years.     Pregnant people should get a dose of Tdap during every pregnancy, preferably during the early part of the third trimester, to help protect the newborn from pertussis. Infants are most at risk for severe, life-threatening complications from pertussis.    Adults who have never received Tdap should get a dose of Tdap.      Also, adults should receive a booster dose of either Tdap or Td (a different vaccine that protects against tetanus and diphtheria but not pertussis) every 10 years, or after 5 years in the case of a severe or dirty wound  or burn.     Tdap may be given at the same time as other vaccines.    3. Talk with your health care provider    Tell your vaccination provider if the person getting the vaccine:  o Has had an allergic reaction after a previous dose of any vaccine that protects against tetanus, diphtheria, or pertussis, or has any severe, life-threatening allergies   o Has had a coma, decreased level of consciousness, or prolonged seizures within 7 days after a previous dose of any pertussis vaccine (DTP, DTaP, or Tdap)  o Has seizures or another nervous system problem  o Has ever had Guillain-Barr Syndrome (also called "GBS")  o Has had severe pain or swelling after a previous dose of any vaccine that protects against tetanus or diphtheria    In some cases, your health care provider may decide to postpone Tdap vaccination until a future visit.    People with minor illnesses, such as a cold, may be vaccinated. People who are moderately or severely ill should usually wait until they recover before getting Tdap vaccine.    Your health care provider can give you more information.    4. Risks of a vaccine reaction    o Pain, redness, or swelling where the shot was given, mild fever, headache, feeling tired, and nausea, vomiting, diarrhea, or stomachache sometimes happen after Tdap vaccination.    People sometimes faint after medical procedures, including vaccination. Tell your provider  if you feel dizzy or have vision changes or ringing in the ears.    As with any medicine, there is a very remote chance of a vaccine causing a severe allergic reaction, other serious injury, or death.    5. What if there is a serious problem?    An allergic reaction could occur after the vaccinated person leaves the clinic. If you see signs of a severe allergic reaction (hives, swelling of the face and throat, difficulty breathing, a fast heartbeat, dizziness, or weakness), call 9-1-1 and get the person to the nearest hospital.    For other signs that  concern you, call your health care provider.    Adverse reactions should be reported to the Vaccine Adverse Event Reporting System (VAERS). Your health care provider will usually file this report, or you can do it yourself. Visit the VAERS website at www.vaers.hhs.gov or call 1-800-822-7967.  VAERS is only for reporting reactions, and VAERS staff members do not give medical advice.    6. The National Vaccine Injury Compensation Program    The National Vaccine Injury Compensation Program (VICP) is a federal program that was created to compensate people who may have been injured by certain vaccines. Claims regarding alleged injury or death due to vaccination have a time limit for filing, which may be as short as two years. Visit the VICP website at www.hrsa.gov/vaccinecompensation or call 1-800-338-2382 to learn about the program and about filing a claim.     7. How can I learn more?    o Ask your health care provider.   o Call your local or state health department.  o Visit the website of the Food and Drug Administration (FDA) for vaccine package inserts and additional information at www.fda.gov/vaccines-blood-biologics/vaccines.  o Contact the Centers for Disease Control and Prevention (CDC):  - Call 1-800-232-4636 (1-800-CDC-INFO) or  - Visit CDC's website at www.cdc.gov/vaccines.    Vaccine Information Statement   Tdap (Tetanus, Diphtheria, Pertussis) Vaccine  12/26/2019  [place]  [id number]     Department of Health and Human Services  Centers for Disease Control and Prevention    Vaccine Information Statement    Influenza (Flu) Vaccine (Inactivated or Recombinant): What You Need to Know    Many vaccine information statements are available in Spanish and other languages. See www.immunize.org/vis.  Hojas de informacin sobre vacunas estn disponibles en espaol y en muchos otros idiomas. Visite www.immunize.org/vis.    1. Why get vaccinated?    Influenza vaccine can prevent influenza (flu).    Flu is a contagious  disease that spreads around the [place] every year, usually between October and May. Anyone can get the flu, but it is more dangerous for some people. Infants and young children, people 65 years and older, pregnant people, and people with certain health conditions or a weakened immune system are at greatest risk of flu complications.    Pneumonia, bronchitis, sinus infections, and ear infections are examples of flu-related complications. If you have a medical condition, such as heart disease, cancer, or diabetes, flu can make it worse.    Flu can cause fever and chills, sore throat, muscle aches, fatigue, cough, headache, and runny or stuffy nose. Some people may have vomiting and diarrhea, though this is more common in children than adults.     In an average year, thousands of people in the [place] die from flu, and many more are hospitalized. Flu vaccine prevents millions of illnesses and flu-related visits to the doctor each   year.    2. Influenza vaccines     CDC recommends everyone 6 months and older get vaccinated every flu season. Children 6 months through 8 years of age may need 2 doses during a single flu season. Everyone else needs only 1 dose each flu season.    It takes about 2 weeks for protection to develop after vaccination.    There are many flu viruses, and they are always changing. Each year a new flu vaccine is made to protect against the influenza viruses believed to be likely to cause disease in the upcoming flu season. Even when the vaccine doesn't exactly match these viruses, it may still provide some protection.     Influenza vaccine does not cause flu.    Influenza vaccine may be given at the same time as other vaccines.    3. Talk with your health care provider    Tell your vaccination provider if the person getting the vaccine:  o Has had an allergic reaction after a previous dose of influenza vaccine, or has any severe, life-threatening allergies   o Has ever had Guillain-Barr  Syndrome (also called "GBS")    In some cases, your health care provider may decide to postpone influenza vaccination until a future visit.    Influenza vaccine can be administered at any time during pregnancy. People who are or will be pregnant during influenza season should receive inactivated influenza vaccine.    People with minor illnesses, such as a cold, may be vaccinated. People who are moderately or severely ill should usually wait until they recover before getting influenza vaccine.    Your health care provider can give you more information.    4. Risks of a vaccine reaction    o Soreness, redness, and swelling where the shot is given, fever, muscle aches, and headache can happen after influenza vaccination.  o There may be a very small increased risk of Guillain-Barr Syndrome (GBS) after inactivated influenza vaccine (the flu shot).    Young children who get the flu shot along with pneumococcal vaccine (PCV13) and/or DTaP vaccine at the same time might be slightly more likely to have a seizure caused by fever. Tell your health care provider if a child who is getting flu vaccine has ever had a seizure.    People sometimes faint after medical procedures, including vaccination. Tell your provider if you feel dizzy or have vision changes or ringing in the ears.    As with any medicine, there is a very remote chance of a vaccine causing a severe allergic reaction, other serious injury, or death.    5. What if there is a serious problem?    An allergic reaction could occur after the vaccinated person leaves the clinic. If you see signs of a severe allergic reaction (hives, swelling of the face and throat, difficulty breathing, a fast heartbeat, dizziness, or weakness), call 9-1-1 and get the person to the nearest hospital.    For other signs that concern you, call your health care provider.    Adverse reactions should be reported to the Vaccine Adverse Event Reporting System (VAERS). Your health care provider  will usually file this report, or you can do it yourself. Visit the VAERS website at www.vaers.hhs.gov or call 1-800-822-7967. VAERS is only for reporting reactions, and VAERS staff members do not give medical advice.    6. The National Vaccine Injury Compensation Program    The National Vaccine Injury Compensation Program (VICP) is a federal program   that was created to compensate people who may have been injured by certain vaccines. Claims regarding alleged injury or death due to vaccination have a time limit for filing, which may be as short as two years. Visit the VICP website at www.hrsa.gov/vaccinecompensation or call 1-800-338-2382 to learn about the program and about filing a claim.     7. How can I learn more?    o Ask your health care provider.   o Call your local or state health department.   o Visit the website of the Food and Drug Administration (FDA) for vaccine package inserts and additional information at www.fda.gov/vaccines-blood-biologics/vaccines.  o Contact the Centers for Disease Control and Prevention (CDC):  - Call 1-800-232-4636 (1-800-CDC-INFO) or  - Visit CDC's influenza website at www.cdc.gov/flu.    Vaccine Information Statement   Inactivated Influenza Vaccine   12/26/2019  [place]  [id number]     Department of Health and Human Services  Centers for Disease Control and Prevention    Vaccine Information Statement    Hepatitis B Vaccine: What You Need to Know    Many vaccine information statements are available in Spanish and other languages. See www.immunize.org/vis.  Hojas de informacin sobre vacunas estn disponibles en espaol y en muchos otros idiomas. Visite www.immunize.org/vis.    1. Why get vaccinated?    Hepatitis B vaccine can prevent hepatitis B. Hepatitis B is a liver disease that can cause mild illness lasting a few weeks, or it can lead to a serious, lifelong illness.      Acute hepatitis B is a short-term illness that can lead to fever, fatigue, loss of appetite, nausea,  vomiting, jaundice (yellow skin or eyes, dark urine, clay-colored bowel movements), and pain in the muscles, joints, and stomach.  Chronic hepatitis B is a long-term illness that occurs when the hepatitis B virus remains in a person's body. Most people who go on to develop chronic hepatitis B do not have symptoms, but it is still very serious and can lead to liver damage (cirrhosis), liver cancer, and death. Chronically infected people can spread hepatitis B virus to others, even if they do not feel or look sick themselves.    Hepatitis B is spread when blood, semen, or other body fluid infected with the hepatitis B virus enters the body of a person who is not infected. People can become infected through:  Birth (if a pregnant person has hepatitis B, their baby can become infected)  Sharing items such as razors or toothbrushes with an infected person  Contact with the blood or open sores of an infected person  Sex with an infected partner  Sharing needles, syringes, or other drug-injection equipment  Exposure to blood from needlesticks or other sharp instruments    Most people who are vaccinated with hepatitis B vaccine are immune for life.     2. Hepatitis B vaccine    Hepatitis B vaccine is usually given as 2, 3, or 4 shots.    Infants should get their first dose of hepatitis B vaccine at birth and will usually complete the series at 6-18 months of age. The birth dose of hepatitis B vaccine is an important part of preventing long-term illness in infants and the spread of hepatitis B in the [place].    Anyone 59 years of age or younger who has not yet gotten the vaccine should be vaccinated.    Hepatitis B vaccination is recommended for adults 60 years or older at increased risk of exposure   to hepatitis B who were not vaccinated previously. Adults 13 years or older who are not at increased risk and were not vaccinated in the past may also be vaccinated.     Hepatitis B vaccine may be given as a stand-alone  vaccine, or as part of a combination vaccine (a type of vaccine that combines more than one vaccine together into one shot).     Hepatitis B vaccine may be given at the same time as other vaccines.    3. Talk with your health care provider    Tell your vaccination provider if the person getting the vaccine:  Has had an allergic reaction after a previous dose of hepatitis B vaccine, or has any severe, life-threatening allergies     In some cases, your health care provider may decide to postpone hepatitis B vaccination until a future visit.    Pregnant or breastfeeding people who were not vaccinated previously should be vaccinated. Pregnancy or breastfeeding are not reasons to avoid hepatitis B vaccination.     People with minor illnesses, such as a cold, may be vaccinated. People who are moderately or severely ill should usually wait until they recover before getting hepatitis B vaccine.    Your health care provider can give you more information.    4. Risks of a vaccine reaction    Soreness where the shot is given, fever, headache, and fatigue (feeling tired) can happen after hepatitis B vaccination.  People sometimes faint after medical procedures, including vaccination. Tell your provider if you feel dizzy or have vision changes or ringing in the ears.    As with any medicine, there is a very remote chance of a vaccine causing a severe allergic reaction, other serious injury, or death.    5. What if there is a serious problem?    An allergic reaction could occur after the vaccinated person leaves the clinic. If you see signs of a severe allergic reaction (hives, swelling of the face and throat, difficulty breathing, a fast heartbeat, dizziness, or weakness), call 9-1-1 and get the person to the nearest hospital.    For other signs that concern you, call your health care provider.    Adverse reactions should be reported to the Vaccine Adverse Event Reporting System (VAERS). Your health care provider will usually  file this report, or you can do it yourself. Visit the VAERS website at www.vaers.SamedayNews.es or call 279-755-9711. VAERS is only for reporting reactions, and VAERS staff members do not give medical advice.    6. The National Vaccine Injury Compensation Program    The [employer] Vaccine Injury Compensation Program (VICP) is a federal program that was created to compensate people who may have been injured by certain vaccines. Claims regarding alleged injury or death due to vaccination have a time limit for filing, which may be as short as two years. Visit the VICP website at GoldCloset.com.ee or call 252-660-5375 to learn about the program and about filing a claim.     7. How can I learn more?    Ask your health care provider.   Call your local or state health department.  Visit the website of the Food and Drug Administration (FDA) for vaccine package inserts and additional information at TraderRating.uy  Contact the Centers for Disease Control and Prevention (CDC):  Call (667)563-4385 (1-800-CDC-INFO) or  Visit CDC's website at http://hunter.com/.    Vaccine Information Statement (Interim)  Hepatitis B Vaccine   09/30/2021  [place]  [id number]  Department of Health and Insurance risk surveyor for Disease Control and Prevention  Vaccine Information Statement    HPV (Human Papillomavirus) Vaccine: What You Need to Know    Many vaccine information statements are available in Spanish and other languages. See [employer]oAge.com.br.  Hojas de informacin sobre vacunas estn disponibles en espaol y en muchos otros idiomas. Visite [employer]Age.com.br.    1. Why get vaccinated?    HPV (human papillomavirus) vaccine can prevent infection with some types of human papillomavirus.     HPV infections can cause certain types of cancers, including:    o cervical, vaginal, and vulvar cancers in women   o penile cancer in men  o anal cancers in both men and women  o cancers of  tonsils, base of tongue, and back of throat (oropharyngeal cancer) in both men and women    HPV infections can also cause anogenital warts.    HPV vaccine can prevent over 90% of cancers caused by HPV.     HPV is spread through intimate skin-to-skin or sexual contact. HPV infections are so common that nearly all people will get at least one type of HPV at some time in their lives. Most HPV infections go away on their own within 2 years. But sometimes HPV infections will last longer and can cause cancers later in life.      2. HPV vaccine    HPV vaccine is routinely recommended for adolescents at 53 or 22 years of age to ensure they are protected before they are exposed to the virus. HPV vaccine may be given beginning at age 47 years and vaccination is recommended for everyone through 22 years of age.       HPV vaccine may be given to adults 27 through 22 years of age, based on discussions between the patient and health care provider.      Most children who get the first dose before 12 years of age need 2 doses of HPV vaccine. People who get the first dose at or after 61 years of age and younger people with certain immunocompromising conditions need 3 doses. Your health care provider can give you more information.    HPV vaccine may be given at the same time as other vaccines.    3. Talk with your health care provider    Tell your vaccination provider if the person getting the vaccine:  o Has had an allergic reaction after a previous dose of HPV vaccine, or has any severe, life-threatening allergies   o Is pregnant-HPV vaccine is not recommended until after pregnancy    In some cases, your health care provider may decide to postpone HPV vaccination until a future visit.    People with minor illnesses, such as a cold, may be vaccinated. People who are moderately or severely ill should usually wait until they recover before getting HPV vaccine.    Your health care provider can give you more information.    4. Risks of a  vaccine reaction    o Soreness, redness, or swelling where the shot is given can happen after HPV vaccination.  o Fever or headache can happen after HPV vaccination.    People sometimes faint after medical procedures, including vaccination. Tell your provider if you feel dizzy or have vision changes or ringing in the ears.    As with any medicine, there is a very remote chance of a vaccine causing a severe allergic reaction, other serious injury, or death.    5.  What if there is a serious problem?    An allergic reaction could occur after the vaccinated person leaves the clinic. If you see signs of a severe allergic reaction (hives, swelling of the face and throat, difficulty breathing, a fast heartbeat, dizziness, or weakness), call 9-1-1 and get the person to the nearest hospital.    For other signs that concern you, call your health care provider.    Adverse reactions should be reported to the Vaccine Adverse Event Reporting System (VAERS). Your health care provider will usually file this report, or you can do it yourself. Visit the VAERS website at www.vaers.SamedayNews.es or call (289) 082-2002. VAERS is only for reporting reactions, and VAERS staff members do not give medical advice.    6. The National Vaccine Injury Compensation Program    The [employer] Vaccine Injury Compensation Program (VICP) is a federal program that was created to compensate people who may have been injured by certain vaccines. Claims regarding alleged injury or death due to vaccination have a time limit for filing, which may be as short as two years. Visit the VICP website at GoldCloset.com.ee or call 6024733916 to learn about the program and about filing a claim.     7. How can I learn more?    o Ask your health care provider.   o Call your local or state health department.  o Visit the website of the Food and Drug Administration (FDA) for vaccine package inserts and additional information at  TraderRating.uy.  o Contact the Centers for Disease Control and Prevention (CDC):  - Call (931)428-8981 (1-800-CDC-INFO) or  - Visit CDC's website at http://hunter.com/.    Vaccine Information Statement   HPV Vaccine   12/26/2019  [place]  [phone]     Department of Health and G[employer]er for Disease Control and Prevention

## 2022-04-04 NOTE — Progress Notes (Unsigned)
Melanie Harvey (DOB:  2001/04/02) is a 21 y.o. female, here for evaluation of the following medical concerns:  Chief Complaint   Patient presents with    Annual Exam     W pap smear           ASSESSMENT/PLAN:  1. Annual physical exam      2. Cervical cancer screening    - PAP (Image Guided), Liquid-based (193000); Future    3. BMI 40.0-44.9, adult ([place])      4. Vulvovaginal candidiasis    - fluconazole (DIFLUCAN) 150 MG tablet; Take 1 tablet by mouth once for 1 dose Then 72 hours from the first dose take the second tablet  Dispense: 2 tablet; Refill: 0  - miconazole (MONISTAT 1 DAY OR NIGHT) 1200 & 2 MG & % kit; Place vaginally once.  Dispense: 1 kit; Refill: 0      Return in about 1 year (around 04/05/2023) for [place], [place].        HPI  She is here today for annual physical with papsmear.  Labs reviewed 02/2022- elevated cholesterol- needs lifestyle management, BMI 42.12  Vaccines due-  Influenza- today  Tdap- today  Hepatitis B-  Hpv-     She is on Junel for BC x 5 years, never had pap. She denies ha, migraines,abdominal pains. Taking for menstrual pain. relief     Celexa- depression and anxiety, onset age 79, started Celexa 1 year ago, with psychology.      Obesity- needs diet and exercise changes.      She tried to complete her first pap today but it was very uncomfortable for her I attempted to collect cells but cervix was not well visualized. She is with a vulvovaginal yeast infection so may not have helped the discomfort level- I will treat and explained monistat and desitin.       Review of Systems   Constitutional:  Negative for diaphoresis and fatigue.   Respiratory:  Negative for cough, chest tightness and shortness of breath.    Cardiovascular:  Negative for chest pain, palpitations and leg swelling.   Gastrointestinal:  Negative for abdominal pain, nausea and vomiting.   Neurological:  Negative for dizziness, weakness and headaches.       Prior to Visit Medications     Medication Sig Taking? Authorizing Provider   fluconazole (DIFLUCAN) 150 MG tablet Take 1 tablet by mouth once for 1 dose Then 72 hours from the first dose take the second tablet Yes Zulema Pulaski, Adele Barthel, APRN - NP   miconazole (MONISTAT 1 DAY OR NIGHT) 1200 & 2 MG & % kit Place vaginally once. Yes Tniya Bowditch, Adele Barthel, APRN - NP   JUNEL FE 1.5/30 1.5-30 MG-MCG tablet Take 1 tablet by mouth daily Yes Ericson Nafziger, Adele Barthel, APRN - NP   citalopram (CELEXA) 20 MG tablet Take 1 tablet by mouth daily Yes [provider]        Social History     Tobacco Use    Smoking status: Never     Passive exposure: Never    Smokeless tobacco: Never   Substance Use Topics    Alcohol use: Never        BP 130/81 (Site: Right Upper Arm, Position: Sitting, Cuff Size: Large Adult)   Pulse 71   Temp 97.7 F (36.5 C) (Temporal)   Resp 17   Ht 1.753 m (5' 9")   Wt 129.5 kg (285 lb 9.6 oz)   LMP 03/07/2022  SpO2 97%   BMI 42.18 kg/m   Estimated body mass index is 42.18 kg/m as calculated from the following:    Height as of this encounter: 1.753 m (5' 9").    Weight as of this encounter: 129.5 kg (285 lb 9.6 oz).  History reviewed. No pertinent past medical history.  Past Surgical History:   Procedure Laterality Date    APPENDECTOMY  2021    TONSILLECTOMY  2017     Family History   Problem Relation Age of Onset    Cancer Father         on kidney, removed in 2020.    Diabetes Father         type 2       Lab Results   Component Value Date    WBC 6.7 02/28/2022    HGB 14.3 02/28/2022    HCT 44.1 02/28/2022    MCV 91.5 02/28/2022    PLT 256 02/28/2022    LYMPHOPCT 34 02/28/2022    RBC 4.82 02/28/2022    MCH 29.7 02/28/2022    MCHC 32.4 02/28/2022    RDW 12.0 02/28/2022     Lab Results   Component Value Date    NA 138 02/28/2022    K 4.1 02/28/2022    CL 107 02/28/2022    CO2 28 02/28/2022    BUN 11 02/28/2022    CREATININE 0.75 02/28/2022    GLUCOSE 85 02/28/2022    CALCIUM 9.0 02/28/2022    PROT 7.5 02/28/2022    LABALBU 3.6  02/28/2022    BILITOT 0.4 02/28/2022    ALKPHOS 87 02/28/2022    AST 14 02/28/2022    ALT 20 02/28/2022    LABGLOM >60 02/28/2022    GLOB 3.9 02/28/2022     Lab Results   Component Value Date    TSH3GEN 2.49 02/28/2022     Hemoglobin A1C   Date Value Ref Range Status   02/28/2022 4.7 4.2 - 5.6 % Final     Comment:     (NOTE)  HbA1C Interpretive Ranges  <5.7              Normal  5.7 - 6.4         Consider Prediabetes  >6.5              Consider Diabetes       Lab Results   Component Value Date    CHOL 238 (H) 02/28/2022     Lab Results   Component Value Date    TRIG 151 (H) 02/28/2022     Lab Results   Component Value Date    HDL 63 (H) 02/28/2022     Lab Results   Component Value Date    LDLCALC 144.8 (H) 02/28/2022     Lab Results   Component Value Date    LABVLDL 30.2 02/28/2022     Lab Results   Component Value Date    CHOLHDLRATIO 3.8 02/28/2022         Physical Exam  Vitals reviewed.   Constitutional:       Appearance: Normal appearance. She is obese.   HENT:      Head: Normocephalic and atraumatic.      Right Ear: Tympanic membrane normal.      Left Ear: Tympanic membrane normal.      Nose: Nose normal.      Mouth/Throat:      Mouth: Mucous membranes are moist.  Pharynx: Oropharynx is clear.   Eyes:      Extraocular Movements: Extraocular movements intact.      Conjunctiva/sclera: Conjunctivae normal.      Pupils: Pupils are equal, round, and reactive to light.   Cardiovascular:      Rate and Rhythm: Normal rate and regular rhythm.      Pulses: Normal pulses.      Heart sounds: Normal heart sounds.   Pulmonary:      Effort: Pulmonary effort is normal.      Breath sounds: Normal breath sounds.   Abdominal:      General: Bowel sounds are normal.      Palpations: Abdomen is soft.   Genitourinary:     General: Normal vulva.      Vagina: Normal.      Cervix: Normal.      Adnexa: Right adnexa normal and left adnexa normal.      Rectum: Normal.   Musculoskeletal:         General: Normal range of motion.       Cervical back: Normal range of motion and neck supple.   Skin:     General: Skin is warm and dry.      Capillary Refill: Capillary refill takes less than 2 seconds.   Neurological:      General: No focal deficit present.      Mental Status: She is alert and oriented to person, place, and time. Mental status is at baseline.   Psychiatric:         Mood and Affect: Mood normal.         Behavior: Behavior normal.         Thought Content: Thought content normal.         Judgment: Judgment normal.             An electronic signature was used to authenticate this note.    --Lieutenant Diego, APRN - NP on 04/04/2022 at 10:06 AM

## 2022-04-19 ENCOUNTER — Encounter

## 2022-04-19 MED ORDER — JUNEL FE 1.5/30 1.5-30 MG-MCG PO TABS
1.5-30 MG-MCG | PACK | Freq: Every day | ORAL | 4 refills | Status: DC
Start: 2022-04-19 — End: 2023-09-05

## 2022-05-12 MED ORDER — CITALOPRAM HYDROBROMIDE 20 MG PO TABS
20 MG | ORAL_TABLET | Freq: Every day | ORAL | 4 refills | Status: AC
Start: 2022-05-12 — End: ?

## 2022-05-13 ENCOUNTER — Other Ambulatory Visit: Payer: Self-pay | Admitting: Nurse Practitioner

## 2022-05-13 DIAGNOSIS — F32A Depression, unspecified: Secondary | ICD-10-CM

## 2022-05-16 NOTE — Telephone Encounter (Signed)
30 days given today - NTBS 

## 2022-05-16 NOTE — Telephone Encounter (Signed)
Pt moved to [place], [place] and has a PCP taking care of her meds there

## 2022-07-31 ENCOUNTER — Inpatient Hospital Stay: Admit: 2022-08-01 | Payer: BLUE CROSS/BLUE SHIELD | Primary: Family

## 2022-07-31 ENCOUNTER — Ambulatory Visit: Admit: 2022-07-31 | Payer: PRIVATE HEALTH INSURANCE | Attending: Family | Primary: Family

## 2022-07-31 DIAGNOSIS — N898 Other specified noninflammatory disorders of vagina: Secondary | ICD-10-CM

## 2022-07-31 DIAGNOSIS — R300 Dysuria: Secondary | ICD-10-CM

## 2022-07-31 LAB — AMB POC URINALYSIS DIP STICK AUTO W/ MICRO
Glucose, Urine, POC: NEGATIVE
Ketones, Urine, POC: NEGATIVE
Leukocyte Esterase, Urine, POC: NEGATIVE
Nitrite, Urine, POC: NEGATIVE
Specific Gravity, Urine, POC: 1.03 (ref 1.001–1.035)
Urobilinogen, POC: 0.2
pH, Urine, POC: 5.5 (ref 4.6–8.0)

## 2022-07-31 MED ORDER — NITROFURANTOIN MONOHYD MACRO 100 MG PO CAPS
100 | ORAL_CAPSULE | Freq: Two times a day (BID) | ORAL | 0 refills | Status: AC
Start: 2022-07-31 — End: 2022-08-05

## 2022-07-31 NOTE — Progress Notes (Signed)
Melanie Harvey (DOB:  Sep 28, 2000) is a 22 y.o. female, here for evaluation of the following medical concerns:  Chief Complaint   Patient presents with    Urinary Frequency     Itching afterwards  Denies pain while urinating, lower abd/back pain, or odor   Started on Saturday.         ASSESSMENT/PLAN:    1. Dysuria  -     AMB POC URINALYSIS DIP STICK AUTO W/ MICRO  -     nitrofurantoin, macrocrystal-monohydrate, (MACROBID) 100 MG capsule; Take 1 capsule by mouth 2 times daily for 5 days, Disp-10 capsule, R-0Normal  -     Culture, Urine; Future  -     NuSwab Vaginitis Plus (VG+) with Candida (Six Species); Future  2. Urinary frequency  -     AMB POC URINALYSIS DIP STICK AUTO W/ MICRO  -     nitrofurantoin, macrocrystal-monohydrate, (MACROBID) 100 MG capsule; Take 1 capsule by mouth 2 times daily for 5 days, Disp-10 capsule, R-0Normal  -     Culture, Urine; Future  -     NuSwab Vaginitis Plus (VG+) with Candida (Six Species); Future  3. Vaginal itching  -     AMB POC URINALYSIS DIP STICK AUTO W/ MICRO  -     nitrofurantoin, macrocrystal-monohydrate, (MACROBID) 100 MG capsule; Take 1 capsule by mouth 2 times daily for 5 days, Disp-10 capsule, R-0Normal  -     Culture, Urine; Future  -     NuSwab Vaginitis Plus (VG+) with Candida (Six Species); Future     Return if symptoms worsen or fail to improve.            HPI  Vaginal itch with urinary frequency onset. Similar concerns 03/2022, started Saturday. She reports feeling of urinary retention, denies vaginal discharge, she denies being sexually active. States food poisoning about 2 weeks ago with n/v/d on 07/20/2022      Hemoglobin A1C   Date Value Ref Range Status   02/28/2022 4.7 4.2 - 5.6 % Final     Comment:     (NOTE)  HbA1C Interpretive Ranges  <5.7              Normal  5.7 - 6.4         Consider Prediabetes  >6.5              Consider Diabetes       BMI 42.18    She is on Junel for BC x 5 years, never had pap. She denies ha, migraines,abdominal pains. Taking for  menstrual pain. relief     Celexa- depression and anxiety, onset age 68, started Celexa 1 year ago, with psychology.      Obesity- needs diet and exercise changes.      She tried to complete her first pap 04/04/2022 but it was very uncomfortable for her I attempted to collect cells but cervix was not well visualized. She is with a vulvovaginal yeast infection so may not have helped the discomfort level- I will treat and explained monistat and desitin.       Review of Systems   Constitutional:  Negative for diaphoresis and fatigue.   Respiratory:  Negative for cough, chest tightness and shortness of breath.    Cardiovascular:  Negative for chest pain, palpitations and leg swelling.   Gastrointestinal:  Negative for abdominal pain, constipation, diarrhea, nausea and vomiting.   Genitourinary:  Positive for dysuria, frequency and urgency. Negative for flank pain, hematuria,  vaginal bleeding, vaginal discharge and vaginal pain.   Neurological:  Negative for dizziness, weakness and headaches.       Prior to Visit Medications    Medication Sig Taking? Authorizing Provider   nitrofurantoin, macrocrystal-monohydrate, (MACROBID) 100 MG capsule Take 1 capsule by mouth 2 times daily for 5 days Yes Alencia Gordon, Adele Barthel, APRN - NP   citalopram (CELEXA) 20 MG tablet Take 1 tablet by mouth daily Yes Dari Carpenito, Adele Barthel, APRN - NP   JUNEL FE 1.5/30 1.5-30 MG-MCG tablet Take 1 tablet by mouth daily Yes Jatinder Mcdonagh, Adele Barthel, APRN - NP        Social History     Tobacco Use    Smoking status: Never     Passive exposure: Never    Smokeless tobacco: Never   Substance Use Topics    Alcohol use: Never        BP (!) 129/90 (Site: Left Upper Arm, Position: Sitting, Cuff Size: Large Adult)   Pulse 93   Temp 98.4 F (36.9 C) (Temporal)   Resp 20   Ht 1.753 m ('5\' 9"'$ )   Wt 129.9 kg (286 lb 6.4 oz)   LMP 07/11/2022   SpO2 98%   BMI 42.29 kg/m   Estimated body mass index is 42.29 kg/m as calculated from the following:    Height as of this  encounter: 1.753 m ('5\' 9"'$ ).    Weight as of this encounter: 129.9 kg (286 lb 6.4 oz).  No past medical history on file.  Past Surgical History:   Procedure Laterality Date    APPENDECTOMY  2021    TONSILLECTOMY  2017     Family History   Problem Relation Age of Onset    Cancer Father         on kidney, removed in 2020.    Diabetes Father         type 2       Lab Results   Component Value Date    WBC 6.7 02/28/2022    HGB 14.3 02/28/2022    HCT 44.1 02/28/2022    MCV 91.5 02/28/2022    PLT 256 02/28/2022    LYMPHOPCT 34 02/28/2022    RBC 4.82 02/28/2022    MCH 29.7 02/28/2022    MCHC 32.4 02/28/2022    RDW 12.0 02/28/2022     Lab Results   Component Value Date    NA 138 02/28/2022    K 4.1 02/28/2022    CL 107 02/28/2022    CO2 28 02/28/2022    BUN 11 02/28/2022    CREATININE 0.75 02/28/2022    GLUCOSE 85 02/28/2022    CALCIUM 9.0 02/28/2022    PROT 7.5 02/28/2022    LABALBU 3.6 02/28/2022    BILITOT 0.4 02/28/2022    ALKPHOS 87 02/28/2022    AST 14 02/28/2022    ALT 20 02/28/2022    LABGLOM >60 02/28/2022    AGRATIO 0.9 02/28/2022    GLOB 3.9 02/28/2022     Lab Results   Component Value Date    TSH3GEN 2.49 02/28/2022     Hemoglobin A1C   Date Value Ref Range Status   02/28/2022 4.7 4.2 - 5.6 % Final     Comment:     (NOTE)  HbA1C Interpretive Ranges  <5.7              Normal  5.7 - 6.4         Consider Prediabetes  >6.5  Consider Diabetes       Lab Results   Component Value Date    CHOL 238 (H) 02/28/2022     Lab Results   Component Value Date    TRIG 151 (H) 02/28/2022     Lab Results   Component Value Date    HDL 63 (H) 02/28/2022     Lab Results   Component Value Date    LDLCALC 144.8 (H) 02/28/2022     No results found for: "VLDL"  Lab Results   Component Value Date    CHOLHDLRATIO 3.8 02/28/2022         Physical Exam  Constitutional:       General: She is not in acute distress.     Appearance: Normal appearance.   HENT:      Head: Normocephalic and atraumatic.   Pulmonary:      Effort: Pulmonary  effort is normal.   Neurological:      General: No focal deficit present.      Mental Status: She is alert and oriented to person, place, and time.   Psychiatric:         Mood and Affect: Mood normal.         Behavior: Behavior normal.         Thought Content: Thought content normal.         Judgment: Judgment normal.         Disclaimer:    The patient understands our medical plan.  Alternatives have been explained and offered.  The risks, benefits and significant side effects of all medications have been reviewed. Anticipated time course and progression of condition reviewed. All questions have been addressed.  She is encouraged to employ the information provided in the after visit summary, which was reviewed.      Where applicable, she is instructed to call the clinic if she has not been notified either by phone or through [place] with the results of her tests/labs or with an appointment plan for any referrals within 1 week(s). The patient is to call if her condition worsens or fails to improve or if significant side effects are experienced.     Please note that this dictation was completed with Dragon, the computer voice recognition software. Quite often unanticipated grammatical, syntax, homophones, and other interpretive errors are inadvertently transcribed by the computer software. Please disregard these errors. Please excuse any errors that have escaped final proofreading.    An electronic signature was used to authenticate this note.    --Lieutenant Diego, APRN - NP on 08/02/2022 at 4:09 PM

## 2022-07-31 NOTE — Progress Notes (Signed)
1. "Have you been to the ER, urgent care clinic since your last visit?  Hospitalized since your last visit?" No     2. "Have you seen or consulted any other health care providers outside of the [facility] since your last visit?" No      If the patient is female:     3. For patients aged 23-65: Has the patient had a pap smear? No

## 2022-08-01 ENCOUNTER — Encounter: Payer: PRIVATE HEALTH INSURANCE | Primary: Family

## 2022-08-03 LAB — CULTURE, URINE
Colony count: 65000
Culture: >2

## 2022-08-04 LAB — NUSWAB VAGINITIS PLUS (VG+) WITH CANDIDA (SIX SPECIES)
Candida Lusitaniae, NAA: NEGATIVE
Candida Parapsilosis/Tropicalis: NEGATIVE
Candida albicans, NAA: NEGATIVE
Candida glabrata: NEGATIVE
Candida krusei: NEGATIVE
Chlamydia trachomatis, NAA: NEGATIVE
Neisseria Gonorrhoeae, NAA: NEGATIVE
Trichomonas Vaginalis by NAA: NEGATIVE

## 2022-08-19 ENCOUNTER — Other Ambulatory Visit: Payer: Self-pay | Admitting: Nurse Practitioner

## 2022-08-19 DIAGNOSIS — F419 Anxiety disorder, unspecified: Secondary | ICD-10-CM

## 2022-10-30 ENCOUNTER — Encounter

## 2023-01-24 ENCOUNTER — Telehealth: Admit: 2023-01-23 | Discharge: 2023-01-24 | Payer: BLUE CROSS/BLUE SHIELD | Attending: Family | Primary: Family

## 2023-01-24 DIAGNOSIS — N926 Irregular menstruation, unspecified: Secondary | ICD-10-CM

## 2023-01-24 NOTE — Progress Notes (Signed)
 [facility], was evaluated through a synchronous (real-time) audio-video encounter. The patient (or guardian if applicable) is aware that this is a billable service, which includes applicable co-pays. This Virtual Visit was conducted with patient's (and/or legal guardian's) consent. Patient identification was verified, and a caregiver was present when appropriate.   The patient was located at Home: [address]  [address]  [place] [address]  Provider was located at Facility (Appt Dept): [address] [place],  [place] [address]  Confirm you are appropriately licensed, registered, or certified to deliver care in the state where the patient is located as indicated above. If you are not or unsure, please re-schedule the visit: Yes, I confirm.     Melanie Harvey (DOB:  [birth year]) is a Established patient, presenting virtually for evaluation of the following:      Below is the assessment and plan developed based on review of pertinent history, physical exam, labs, studies, and medications.     Assessment & Plan      No follow-ups on file.       Subjective   HPI  Onset 3 months of having irregular periods while on Junel  FE 1.5/30, she has been on this medication since age 32. She is having breakthrough bleeds in the middle of the pack for 2-3 days light menses, cramping, and then at the end of the month with her regular period last 4 days with the first 2 days heavier.     I would like to trial a triphasic BC       Review of Systems   Respiratory:  Negative for chest tightness.    Cardiovascular:  Negative for chest pain.   Gastrointestinal:  Negative for abdominal pain.   Genitourinary:  Positive for menstrual problem.   Neurological:  Negative for dizziness and headaches.          Objective   Patient-Reported Vitals  No data recorded     Physical Exam             --Melanie CHRISTELLA Diesel, APRN - NP

## 2023-01-24 NOTE — Progress Notes (Signed)
 [facility], was evaluated through a synchronous (real-time) audio-video encounter. The patient (or guardian if applicable) is aware that this is a billable service, which includes applicable co-pays. This Virtual Visit was conducted with patient's (and/or legal guardian's) consent. Patient identification was verified, and a caregiver was present when appropriate.   The patient was located at Home: [address]  [address]  [place] [address]  Provider was located at Facility (Appt Dept): [address] [place],  [place] [address]  Confirm you are appropriately licensed, registered, or certified to deliver care in the state where the patient is located as indicated above. If you are not or unsure, please re-schedule the visit: Yes, I confirm.     Marquasia Fraleigh (DOB:  23-Nov-2000) is a Established patient, presenting virtually for evaluation of the following:        --Jada Ward

## 2023-01-30 MED ORDER — NORGESTIM-ETH ESTRAD TRIPHASIC 0.18/0.215/0.25 MG-35 MCG PO TABS
0.180.2150.25-35 | PACK | ORAL | 1 refills | Status: DC
Start: 2023-01-30 — End: 2023-07-20

## 2023-05-29 MED ORDER — CITALOPRAM HYDROBROMIDE 20 MG PO TABS
20 MG | ORAL_TABLET | Freq: Every day | ORAL | 0 refills | Status: DC
Start: 2023-05-29 — End: 2023-09-05

## 2023-07-02 IMAGING — DX DG LUMBAR SPINE 2-3V
3 series · 3 of 3 positions shown · non-contrast
Comparison: None Available.

CLINICAL DATA: Back pain

EXAM:
LUMBAR SPINE - 2-3 VIEW

[l-spine ap]
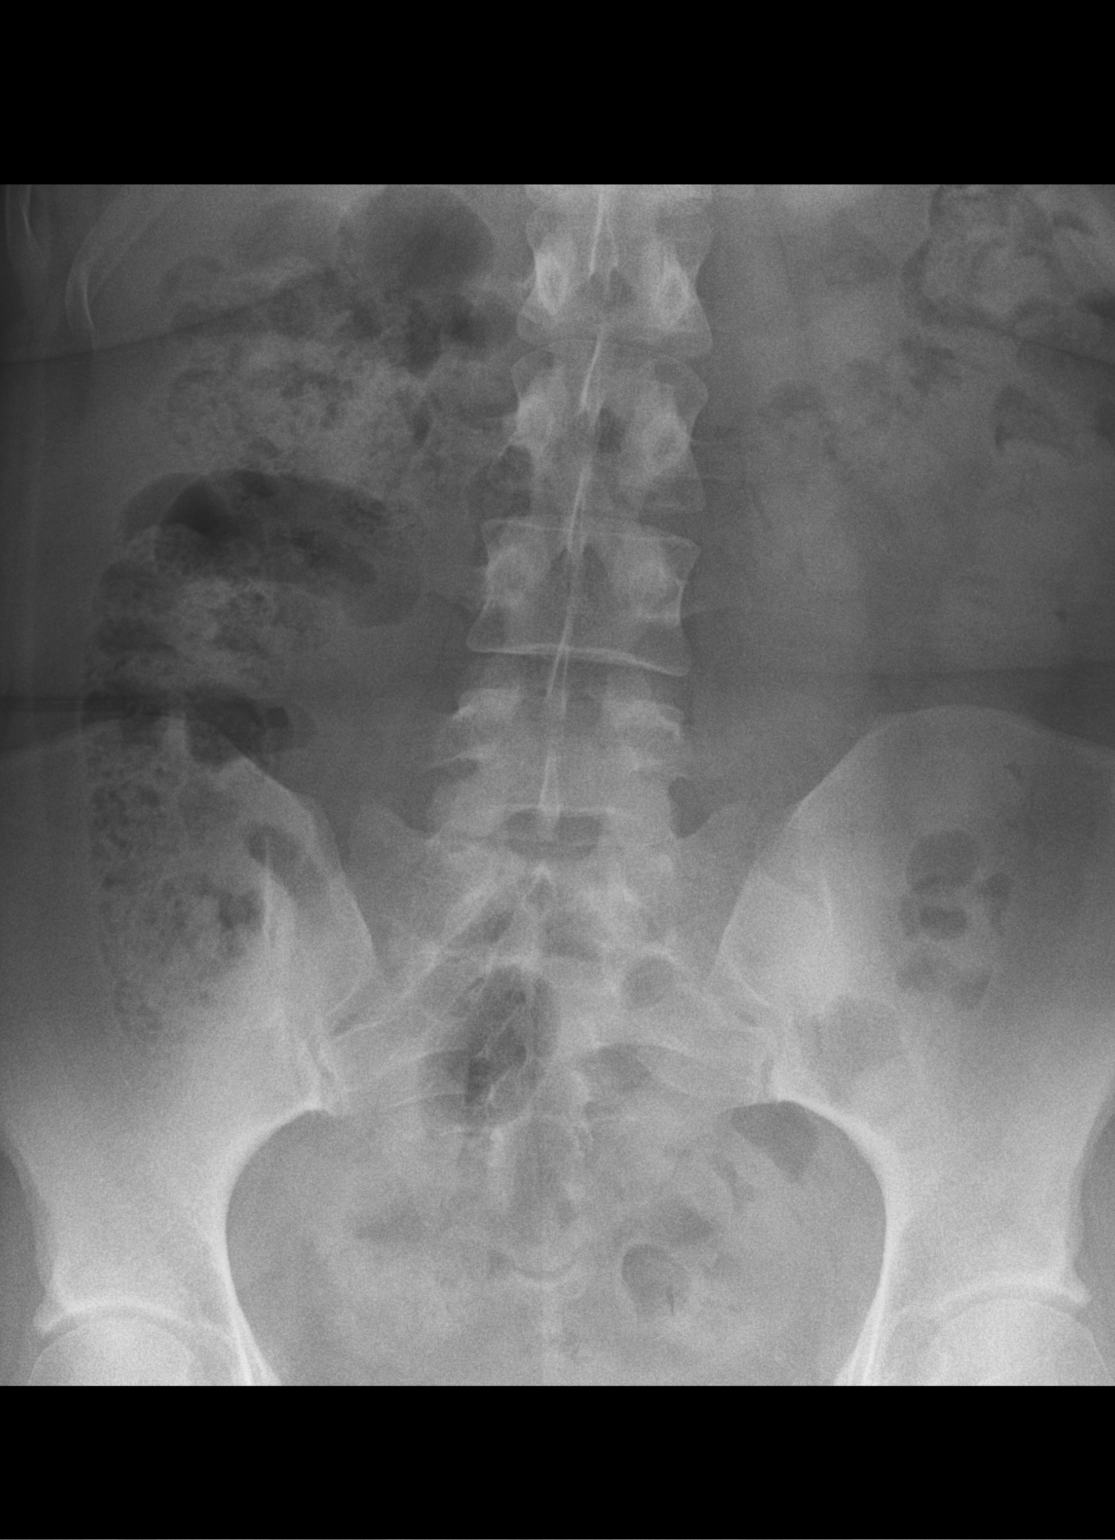

[l-spine lat (1 of 2)]
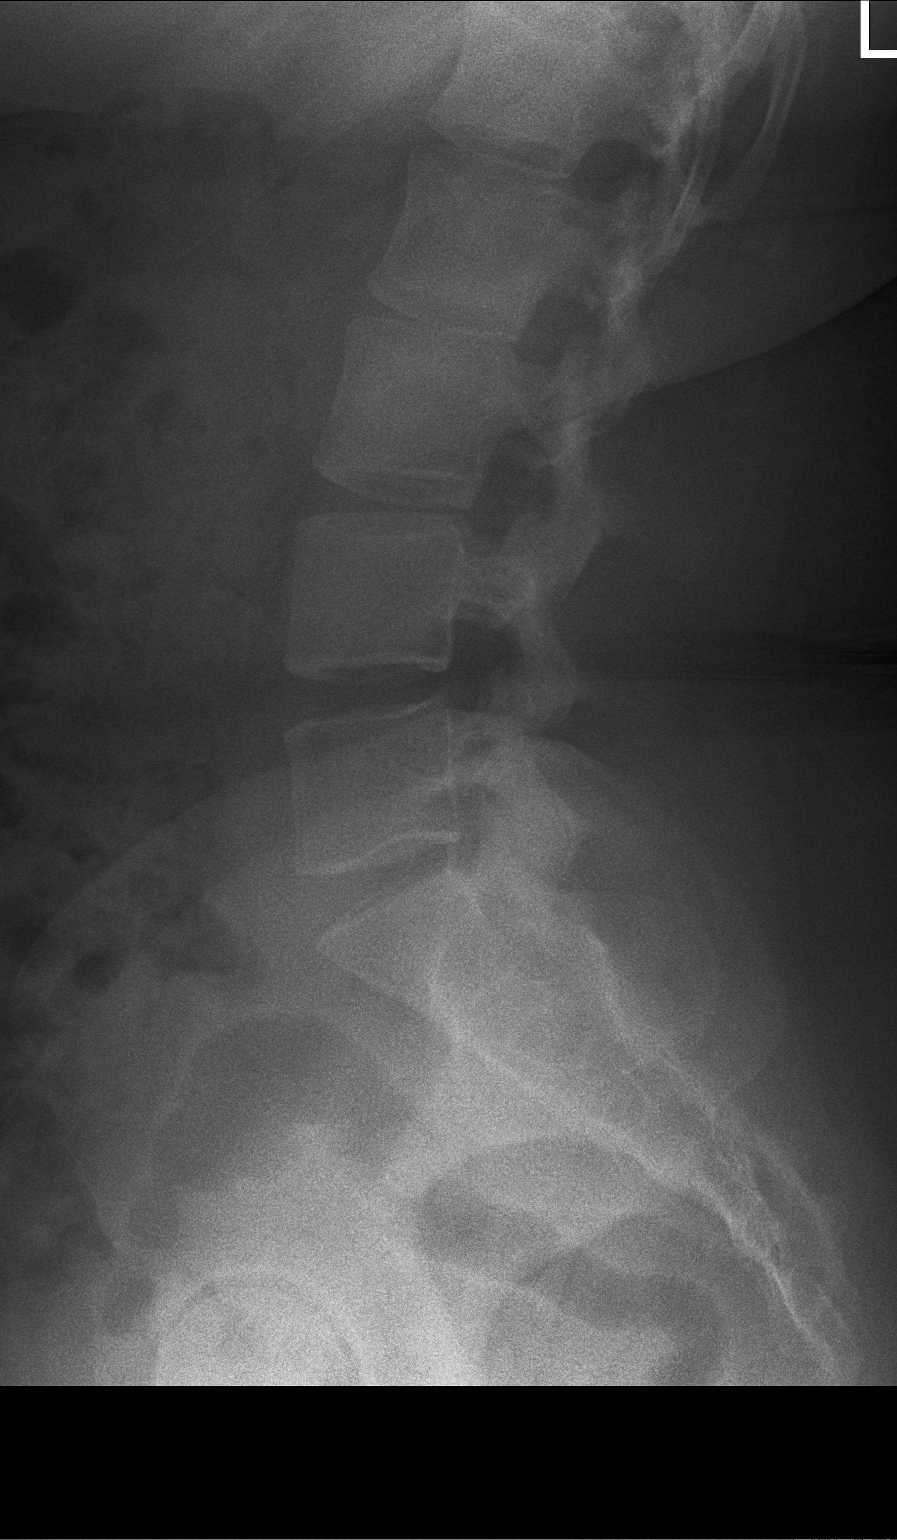

[l-spine lat (2 of 2)]
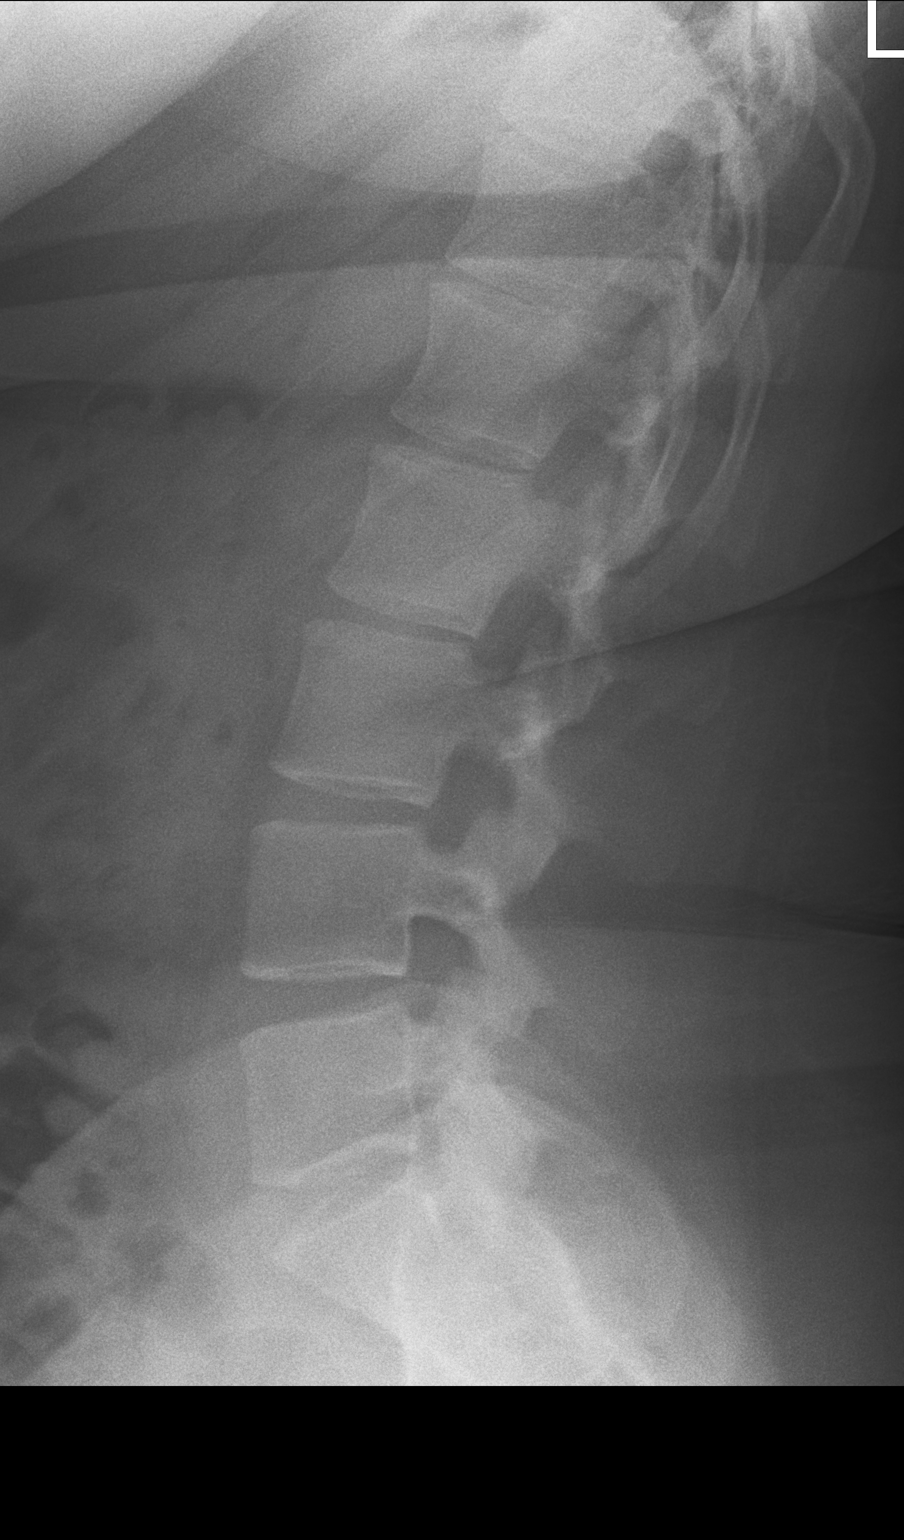

[3 of 3 positions shown; findings below may reference images not displayed]

FINDINGS: There is no evidence of lumbar spine fracture. Alignment is normal.
Intervertebral disc spaces are maintained.
IMPRESSION: Negative.

## 2023-07-16 NOTE — Telephone Encounter (Signed)
 Last Visit: 01/30/2023   Next Appointment: N/A  Previous Refill Encounter:        Disp-3 packet, R-1 Start: 01/30/2023

## 2023-07-21 MED ORDER — NORGESTIM-ETH ESTRAD TRIPHASIC 0.18/0.215/0.25 MG-35 MCG PO TABS
0.180.2150.25 MG-35 MCG | ORAL_TABLET | ORAL | 1 refills | Status: DC
Start: 2023-07-21 — End: 2023-09-05

## 2023-09-05 ENCOUNTER — Encounter: Payer: BLUE CROSS/BLUE SHIELD | Primary: Family

## 2023-09-05 ENCOUNTER — Ambulatory Visit: Admit: 2023-09-05 | Discharge: 2023-09-05 | Payer: BLUE CROSS/BLUE SHIELD | Attending: Family | Primary: Family

## 2023-09-05 DIAGNOSIS — Z0000 Encounter for general adult medical examination without abnormal findings: Principal | ICD-10-CM

## 2023-09-05 MED ORDER — NORGESTIM-ETH ESTRAD TRIPHASIC 0.18/0.215/0.25 MG-35 MCG PO TABS
0.180.2150.25 | ORAL_TABLET | ORAL | 3 refills | Status: AC
Start: 2023-09-05 — End: ?

## 2023-09-05 MED ORDER — CITALOPRAM HYDROBROMIDE 20 MG PO TABS
20 | ORAL_TABLET | Freq: Every day | ORAL | 3 refills | Status: AC
Start: 2023-09-05 — End: ?

## 2023-09-05 NOTE — Progress Notes (Signed)
 "  Have you been to the ER, urgent care clinic since your last visit?  Hospitalized since your last visit?"    NO    "Have you seen or consulted any other health care providers outside our system since your last visit?"    NO

## 2023-09-05 NOTE — Progress Notes (Signed)
 Melanie Harvey (DOB:  02/17/2001) is a 23 y.o. female, here for evaluation of the following medical concerns:  Chief Complaint   Patient presents with    Annual Exam     Feet hurt 24/7 feels like she's stepping on lego's. Elevating feet, icing, insoles for shoes nothing helping.          ASSESSMENT/PLAN:    Assessment & Plan    Annual physical exam  -     Lipid Panel; Future  -     Urinalysis with Microscopic; Future  -     TSH; Future  -     Hemoglobin A1C; Future  -     Comprehensive Metabolic Panel; Future  -     CBC with Auto Differential; Future  Cervical cancer screening  Immunization due  Screening examination for STD (sexually transmitted disease)  BMI 40.0-44.9, adult  -     Lipid Panel; Future  -     TSH; Future  -     Hemoglobin A1C; Future  Chronic pain of both feet  -     Amb External Referral To Podiatry  Excessive daytime sleepiness  -     [facility] - Laraine Plate, DO, Sleep Medicine [facility])  -     Vitamin D 25 Hydroxy; Future  Class 3 severe obesity due to excess calories without serious comorbidity with body mass index (BMI) of 40.0 to 44.9 in adult    1. Foot pain.  - The symptoms suggest a possible diagnosis of plantar fasciitis. Pain worsens with prolonged standing and is present upon waking and after periods of inactivity.  - Physical examination reveals no fungal discoloration or abnormalities in toenail growth. Pulses are normal.  - A referral to podiatry has been made for further evaluation. She has been provided with exercises to strengthen her ankles and feet, and to stretch the Achilles tendon area. She has been advised to perform these exercises several times daily, particularly before starting her day. Handouts in [place]  - She has also been instructed to apply ice to the affected area multiple times daily for 15 to 20 minutes each session.    2. Fatigue.  - The fatigue could potentially be attributed to sleep apnea. She reports excessive daytime sleepiness and snoring.  - A  referral to a sleep medicine provider has been made for further evaluation.  - Her vitamin D levels will also be checked.    3. Medication management.  - She is currently taking Celexa and birth control pills. A one-year supply of Celexa and birth control pills has been provided.  - She has been advised to continue taking her medications as prescribed.    4. Health maintenance.  - She has been advised to receive the hepatitis B vaccine, which consists of two doses.  - She has also been recommended to undergo a Pap smear either this year or the next.  - Labs have been ordered, including CBC, electrolytes, kidney function, liver function, A1c, thyroid function, urine analysis, and cholesterol levels.  - She has declined STD screening.  - She has been advised to schedule an eye exam and dental exam as part of her preventative care.    Results       History of Present Illness    Annual physical  Vaccines due  Hep b  Papsmear- completed 2023- due please schedule in near future  Labs- ordred tody    Foot pain has been present for the past 1 to  2 years, progressively worsening. Pain is experienced upon waking up and standing after sitting for a while, causing limping. No issues with the knees are reported. Different shoe insoles, elevation, and ice application have been tried without relief. She is currently working at [employer].    Persistent fatigue is reported, attributed to a history of depression. Despite medication and therapy, tiredness continues. Sleep duration includes approximately napping 3 hours after work and an additional 8 hours at night. Snoring is also uncertain, she is with obesity BMI 43.92. Celexa has been taken consistently for the past month, but fatigue persists. She denies depression/anxiety concerns today.    Birth control is currently used.    Review of Systems   Constitutional:  Negative for diaphoresis and fatigue.   Respiratory:  Negative for cough, chest tightness and shortness of breath.     Cardiovascular:  Negative for chest pain, palpitations and leg swelling.   Gastrointestinal:  Negative for abdominal pain, nausea and vomiting.   Neurological:  Negative for dizziness, weakness and headaches.       Prior to Visit Medications    Medication Sig Taking? Authorizing Provider   citalopram (CELEXA) 20 MG tablet Take 1 tablet by mouth daily Yes Nahun Kronberg, Laurence Pons, APRN - NP   Norgestim-Eth Ezzard Holms Triphasic (TRI-ESTARYLLA) 0.18/0.215/0.25 MG-35 MCG TABS TAKE 1 TABLET BY MOUTH EVERY DAY Yes Lenoir Facchini, Laurence Pons, APRN - NP   betamethasone dipropionate 0.05 % ointment Apply topically 2 times daily Yes [provider]        Social History     Tobacco Use    Smoking status: Never     Passive exposure: Never    Smokeless tobacco: Never   Substance Use Topics    Alcohol use: Never        BP (!) 133/90 (BP Site: Left Upper Arm, Patient Position: Sitting)   Pulse 79   Temp 98.1 F (36.7 C)   Wt 134.9 kg (297 lb 6.4 oz)   SpO2 97%   BMI 43.92 kg/m   Estimated body mass index is 43.92 kg/m as calculated from the following:    Height as of 07/31/22: 1.753 m (5\' 9" ).    Weight as of this encounter: 134.9 kg (297 lb 6.4 oz).  Past Medical History:   Diagnosis Date    Anxiety     Depression      Past Surgical History:   Procedure Laterality Date    APPENDECTOMY  2021    TONSILLECTOMY  2017     Family History   Problem Relation Age of Onset    Cancer Father         on kidney, removed in 2020.    Diabetes Father         type 2       Lab Results   Component Value Date    WBC 6.7 02/28/2022    HGB 14.3 02/28/2022    HCT 44.1 02/28/2022    MCV 91.5 02/28/2022    PLT 256 02/28/2022    LYMPHOPCT 34 02/28/2022    RBC 4.82 02/28/2022    MCH 29.7 02/28/2022    MCHC 32.4 02/28/2022    RDW 12.0 02/28/2022     Lab Results   Component Value Date    NA 138 02/28/2022    K 4.1 02/28/2022    CL 107 02/28/2022    CO2 28 02/28/2022    BUN 11 02/28/2022    CREATININE 0.75 02/28/2022    GLUCOSE 85 02/28/2022  CALCIUM 9.0  02/28/2022    BILITOT 0.4 02/28/2022    ALKPHOS 87 02/28/2022    AST 14 02/28/2022    ALT 20 02/28/2022    LABGLOM >60 02/28/2022    GLOB 3.9 02/28/2022     Lab Results   Component Value Date    TSH 2.49 02/28/2022     Hemoglobin A1C   Date Value Ref Range Status   02/28/2022 4.7 4.2 - 5.6 % Final     Comment:     (NOTE)  HbA1C Interpretive Ranges  <5.7              Normal  5.7 - 6.4         Consider Prediabetes  >6.5              Consider Diabetes       Lab Results   Component Value Date    CHOL 238 (H) 02/28/2022     Lab Results   Component Value Date    TRIG 151 (H) 02/28/2022     Lab Results   Component Value Date    HDL 63 (H) 02/28/2022     No components found for: "LDLCHOLESTEROL", "LDLCALC"  Lab Results   Component Value Date    VLDL 30.2 02/28/2022     Lab Results   Component Value Date    CHOLHDLRATIO 3.8 02/28/2022         Physical Exam  Vitals reviewed.   Constitutional:       Appearance: Normal appearance.   HENT:      Head: Normocephalic and atraumatic.      Right Ear: Tympanic membrane normal.      Left Ear: Tympanic membrane normal.      Nose: Nose normal.      Mouth/Throat:      Mouth: Mucous membranes are moist.      Pharynx: Oropharynx is clear.   Eyes:      Extraocular Movements: Extraocular movements intact.      Conjunctiva/sclera: Conjunctivae normal.      Pupils: Pupils are equal, round, and reactive to light.   Cardiovascular:      Rate and Rhythm: Normal rate and regular rhythm.      Pulses: Normal pulses.      Heart sounds: Normal heart sounds.   Pulmonary:      Effort: Pulmonary effort is normal.      Breath sounds: Normal breath sounds.   Abdominal:      General: Bowel sounds are normal.      Palpations: Abdomen is soft.   Musculoskeletal:         General: Normal range of motion.      Cervical back: Normal range of motion and neck supple.   Skin:     General: Skin is warm and dry.      Capillary Refill: Capillary refill takes less than 2 seconds.   Neurological:      General: No focal  deficit present.      Mental Status: She is alert and oriented to person, place, and time. Mental status is at baseline.   Psychiatric:         Mood and Affect: Mood normal.         Behavior: Behavior normal.         Thought Content: Thought content normal.         Judgment: Judgment normal.         Disclaimer:    The patient understands  our medical plan.  Alternatives have been explained and offered.  The risks, benefits and significant side effects of all medications have been reviewed. Anticipated time course and progression of condition reviewed. All questions have been addressed.  She is encouraged to employ the information provided in the after visit summary, which was reviewed.      Where applicable, she is instructed to call the clinic if she has not been notified either by phone or through MyChart with the results of her tests/labs or with an appointment plan for any referrals within 1 week(s). The patient is to call if her condition worsens or fails to improve or if significant side effects are experienced.     Please note that this dictation was completed with Dragon, the computer voice recognition software. Quite often unanticipated grammatical, syntax, homophones, and other interpretive errors are inadvertently transcribed by the computer software. Please disregard these errors. Please excuse any errors that have escaped final proofreading.    An electronic signature was used to authenticate this note.          --Bayard Beaver, APRN - NP on 09/05/2023 at 10:42 AM

## 2023-09-05 NOTE — Patient Instructions (Signed)
 Vaccine Information Statement    Hepatitis B Vaccine: What You Need to Know    Many vaccine information statements are available in Spanish and other languages. See PromoAge.com.br.  Hojas de informacin sobre vacunas estn disponibles en espaol y en muchos otros idiomas. Visite PromoAge.com.br.    1. Why get vaccinated?    Hepatitis B vaccine can prevent hepatitis B. Hepatitis B is a liver disease that can cause mild illness lasting a few weeks, or it can lead to a serious, lifelong illness.      Acute hepatitis B is a short-term illness that can lead to fever, fatigue, loss of appetite, nausea, vomiting, jaundice (yellow skin or eyes, dark urine, clay-colored bowel movements), and pain in the muscles, joints, and stomach.  Chronic hepatitis B is a long-term illness that occurs when the hepatitis B virus remains in a person's body. Most people who go on to develop chronic hepatitis B do not have symptoms, but it is still very serious and can lead to liver damage (cirrhosis), liver cancer, and death. Chronically infected people can spread hepatitis B virus to others, even if they do not feel or look sick themselves.    Hepatitis B is spread when blood, semen, or other body fluid infected with the hepatitis B virus enters the body of a person who is not infected. People can become infected through:  Birth (if a pregnant woman has hepatitis B, her baby can become infected)  Sharing items such as razors or toothbrushes with an infected person  Contact with the blood or open sores of an infected person  Sex with an infected partner  Sharing needles, syringes, or other drug-injection equipment  Exposure to blood from needlesticks or other sharp instruments    Most people who are vaccinated with hepatitis B vaccine are immune for life.     2. Hepatitis B vaccine    Hepatitis B vaccine is usually given as 2, 3, or 4 shots.    Infants should get their first dose of hepatitis B vaccine at birth and will usually  complete the series at 43-25 months of age. The birth dose of hepatitis B vaccine is an important part of preventing long-term illness in infants and the spread of hepatitis B in the [place].    Anyone 26 years of age or younger who has not yet gotten the vaccine should be vaccinated.    Hepatitis B vaccination is recommended for adults 60 years or older at increased risk of exposure to hepatitis B who were not vaccinated previously. Adults 60 years or older who are not at increased risk and were not vaccinated in the past may also be vaccinated.     Hepatitis B vaccine may be given as a stand-alone vaccine, or as part of a combination vaccine (a type of vaccine that combines more than one vaccine together into one shot).     Hepatitis B vaccine may be given at the same time as other vaccines.    3. Talk with your health care provider    Tell your vaccination provider if the person getting the vaccine:  Has had an allergic reaction after a previous dose of hepatitis B vaccine, or has any severe, life-threatening allergies     In some cases, your health care provider may decide to postpone hepatitis B vaccination until a future visit.    Pregnant or breastfeeding women who were not vaccinated previously should be vaccinated. Pregnancy or breastfeeding are not reasons to avoid hepatitis  B vaccination.     People with minor illnesses, such as a cold, may be vaccinated. People who are moderately or severely ill should usually wait until they recover before getting hepatitis B vaccine.    Your health care provider can give you more information.    4. Risks of a vaccine reaction    Soreness where the shot is given, fever, headache, and fatigue (feeling tired) can happen after hepatitis B vaccination.  People sometimes faint after medical procedures, including vaccination. Tell your provider if you feel dizzy or have vision changes or ringing in the ears.    As with any medicine, there is a very remote chance of a  vaccine causing a severe allergic reaction, other serious injury, or death.    5. What if there is a serious problem?    An allergic reaction could occur after the vaccinated person leaves the clinic. If you see signs of a severe allergic reaction (hives, swelling of the face and throat, difficulty breathing, a fast heartbeat, dizziness, or weakness), call 9-1-1 and get the person to the nearest hospital.    For other signs that concern you, call your health care provider.    Adverse reactions should be reported to the Vaccine Adverse Event Reporting System (VAERS). Your health care provider will usually file this report, or you can do it yourself. Visit the VAERS website at www.vaers.LAgents.no or call 740-274-6741. VAERS is only for reporting reactions, and VAERS staff members do not give medical advice.    6. The National Vaccine Injury Compensation Program    The [employer] Vaccine Injury Compensation Program (VICP) is a federal program that was created to compensate people who may have been injured by certain vaccines. Claims regarding alleged injury or death due to vaccination have a time limit for filing, which may be as short as two years. Visit the VICP website at SpiritualWord.at or call 331-393-5120 to learn about the program and about filing a claim.     7. How can I learn more?    Ask your health care provider.   Call your local or state health department.  Visit the website of the Food and Drug Administration (FDA) for vaccine package inserts and additional information at FinderList.no  Contact the Centers for Disease Control and Prevention (CDC):  Call 775-526-1724 (1-800-CDC-INFO) or  Visit CDC's website at PicCapture.uy.    Vaccine Information Statement (Interim)  Hepatitis B Vaccine   06/22/2023  42 [place]  [id number]     Department of Health and Insurance risk surveyor for Disease Control and Prevention    Office Use Only  Vaccine Information  Statement    HPV (Human Papillomavirus) Vaccine: What You Need to Know    Many vaccine information statements are available in Spanish and other languages. See PromoAge.com.br.  Hojas de informacin sobre vacunas estn disponibles en espaol y en muchos otros idiomas. Visite PromoAge.com.br.    1. Why get vaccinated?    HPV (human papillomavirus) vaccine can prevent infection with some types of human papillomavirus.     HPV infections can cause certain types of cancers, including:    o cervical, vaginal, and vulvar cancers in women   o penile cancer in men  o anal cancers in both men and women  o cancers of tonsils, base of tongue, and back of throat (oropharyngeal cancer) in both men and women    HPV infections can also cause anogenital warts.    HPV vaccine can prevent over 90% of  cancers caused by HPV.     HPV is spread through intimate skin-to-skin or sexual contact. HPV infections are so common that nearly all people will get at least one type of HPV at some time in their lives. Most HPV infections go away on their own within 2 years. But sometimes HPV infections will last longer and can cause cancers later in life.      2. HPV vaccine    HPV vaccine is routinely recommended for adolescents at 64 or 24 years of age to ensure they are protected before they are exposed to the virus. HPV vaccine may be given beginning at age 58 years and vaccination is recommended for everyone through 24 years of age.       HPV vaccine may be given to adults 27 through 24 years of age, based on discussions between the patient and health care provider.      Most children who get the first dose before 60 years of age need 2 doses of HPV vaccine. People who get the first dose at or after 47 years of age and younger people with certain immunocompromising conditions need 3 doses. Your health care provider can give you more information.    HPV vaccine may be given at the same time as other vaccines.    3. Talk with your health  care provider    Tell your vaccination provider if the person getting the vaccine:  o Has had an allergic reaction after a previous dose of HPV vaccine, or has any severe, life-threatening allergies   o Is pregnant-HPV vaccine is not recommended until after pregnancy    In some cases, your health care provider may decide to postpone HPV vaccination until a future visit.    People with minor illnesses, such as a cold, may be vaccinated. People who are moderately or severely ill should usually wait until they recover before getting HPV vaccine.    Your health care provider can give you more information.    4. Risks of a vaccine reaction    o Soreness, redness, or swelling where the shot is given can happen after HPV vaccination.  o Fever or headache can happen after HPV vaccination.    People sometimes faint after medical procedures, including vaccination. Tell your provider if you feel dizzy or have vision changes or ringing in the ears.    As with any medicine, there is a very remote chance of a vaccine causing a severe allergic reaction, other serious injury, or death.    5. What if there is a serious problem?    An allergic reaction could occur after the vaccinated person leaves the clinic. If you see signs of a severe allergic reaction (hives, swelling of the face and throat, difficulty breathing, a fast heartbeat, dizziness, or weakness), call 9-1-1 and get the person to the nearest hospital.    For other signs that concern you, call your health care provider.    Adverse reactions should be reported to the Vaccine Adverse Event Reporting System (VAERS). Your health care provider will usually file this report, or you can do it yourself. Visit the VAERS website at www.vaers.LAgents.no or call (516)587-5019. VAERS is only for reporting reactions, and VAERS staff members do not give medical advice.    6. The National Vaccine Injury Compensation Program    The [employer] Vaccine Injury Compensation Program (VICP) is a  federal program that was created to compensate people who may have been injured by certain vaccines. Claims  regarding alleged injury or death due to vaccination have a time limit for filing, which may be as short as two years. Visit the VICP website at SpiritualWord.at or call 7271477763 to learn about the program and about filing a claim.     7. How can I learn more?    o Ask your health care provider.   o Call your local or state health department.  o Visit the website of the Food and Drug Administration (FDA) for vaccine package inserts and additional information at FinderList.no.  o Contact the Centers for Disease Control and Prevention (CDC):  - Call 2032265138 (1-800-CDC-INFO) or  - Visit CDC's website at PicCapture.uy.    Vaccine Information Statement   HPV Vaccine   12/26/2019  42 [place]  [id number]     Department of Health and Insurance risk surveyor for Disease Control and Prevention

## 2023-09-06 ENCOUNTER — Inpatient Hospital Stay: Admit: 2023-09-06 | Payer: BLUE CROSS/BLUE SHIELD | Primary: Family

## 2023-09-06 LAB — COMPREHENSIVE METABOLIC PANEL
ALT: 28 U/L (ref 13–56)
AST: 16 U/L (ref 10–38)
Albumin/Globulin Ratio: 1.1 (ref 0.8–1.7)
Albumin: 4 g/dL (ref 3.4–5.0)
Alk Phosphatase: 86 U/L (ref 45–117)
Anion Gap: 5 mmol/L (ref 3.0–18)
BUN/Creatinine Ratio: 18 (ref 12–20)
BUN: 12 mg/dL (ref 7.0–18)
CO2: 27 mmol/L (ref 21–32)
Calcium: 9.5 mg/dL (ref 8.5–10.1)
Chloride: 104 mmol/L (ref 100–111)
Creatinine: 0.68 mg/dL (ref 0.6–1.3)
Est, Glom Filt Rate: >90 mL/min/{1.73_m2} (ref >60)
Globulin: 3.6 g/dL (ref 2.0–4.0)
Glucose: 79 mg/dL (ref 74–99)
Potassium: 4.5 mmol/L (ref 3.5–5.5)
Sodium: 136 mmol/L (ref 136–145)
Total Bilirubin: 0.8 mg/dL (ref 0.2–1.0)
Total Protein: 7.6 g/dL (ref 6.4–8.2)

## 2023-09-06 LAB — CBC WITH AUTO DIFFERENTIAL
Basophils %: 0.5 % (ref 0.0–2.0)
Basophils Absolute: 0.05 10*3/uL (ref 0.00–0.10)
Eosinophils %: 4.5 % (ref 0.0–5.0)
Eosinophils Absolute: 0.41 10*3/uL — ABNORMAL HIGH (ref 0.00–0.40)
Hematocrit: 46.3 % — ABNORMAL HIGH (ref 35.0–45.0)
Hemoglobin: 14.6 g/dL (ref 12.0–16.0)
Immature Granulocytes %: 0.4 % (ref 0.0–0.5)
Immature Granulocytes Absolute: 0.04 10*3/uL (ref 0.00–0.04)
Lymphocytes %: 26.5 % (ref 21.0–52.0)
Lymphocytes Absolute: 2.42 10*3/uL (ref 0.90–3.60)
MCH: 29.2 pg (ref 24.0–34.0)
MCHC: 31.5 g/dL (ref 31.0–37.0)
MCV: 92.6 FL (ref 78.0–100.0)
MPV: 11.3 FL (ref 9.2–11.8)
Monocytes %: 7 % (ref 3.0–10.0)
Monocytes Absolute: 0.64 10*3/uL (ref 0.05–1.20)
Neutrophils %: 61.1 % (ref 40.0–73.0)
Neutrophils Absolute: 5.56 10*3/uL (ref 1.80–8.00)
Nucleated RBCs: 0 /100{WBCs}
Platelets: 234 10*3/uL (ref 135–420)
RBC: 5 M/uL (ref 4.20–5.30)
RDW: 12.1 % (ref 11.6–14.5)
WBC: 9.1 10*3/uL (ref 4.6–13.2)
nRBC: 0 10*3/uL (ref 0.00–0.01)

## 2023-09-06 LAB — LIPID PANEL
Chol/HDL Ratio: 3.1 (ref 0–5.0)
Cholesterol, Total: 171 mg/dL (ref <200)
HDL: 56 mg/dL (ref 40–60)
LDL Cholesterol: 103 mg/dL — ABNORMAL HIGH (ref 0–100)
Triglycerides: 60 mg/dL (ref <150)
VLDL Cholesterol Calculated: 12 mg/dL

## 2023-09-06 LAB — HEMOGLOBIN A1C
Estimated Avg Glucose: 85 mg/dL
Hemoglobin A1C: 4.6 % (ref 4.2–5.6)

## 2023-09-06 LAB — TSH: TSH, 3rd Generation: 2.24 u[IU]/mL (ref 0.36–3.74)

## 2023-09-06 LAB — VITAMIN D 25 HYDROXY: Vit D, 25-Hydroxy: 31.7 ng/mL (ref 30–100)

## 2023-09-18 NOTE — Patient Instructions (Signed)
 Please make a follow up appointment to discuss the results of your sleep study or I will send you a "my chart" message with your results and treatment options.     The [facility] is located in the [facility], adjacent to [facility]. The lab is on the second floor. The direct number to call for sleep study related questions is: [phone].    Please call our clinic back at [phone] or send a message on MyChart if you have any questions or concerns or if you are experiencing any of the following:     You have not received a follow up appointment within 30 days prior the recommended follow up time.    If you are not tolerating treatment plan and/or not able to obtain equipment or prescribed medication(s).  if you are experiencing any difficulties with the Durable Medical Equipment  (DME) Company you may be using or is assigned to you.  Two weeks have passed and you have not received an appointment for a scheduled procedure.  Two weeks have passed since you underwent a test and/or procedure and you have not received your results.  Your  Durable Medical Equipment (DME ) company is supposed to provide you with replacement filters, tubing and masks. You can either call your DME when you need new supplies or you can arrange for an automatic shipment schedule.    Your need to be seen by our office at lat minimum of every 12 months in order to renew the prescription for these supplies.   Please make note of who your DME company is and their phone number.   Please make sure that you clean your mask and hosing on a regular basis.  Your DME can provide you with additional information regarding proper care and cleaning of your device    Driver Safety is strongly encouraged.  You should not drive if sleepy, tired, distracted and/or fatigued.      Chest Xrays and blood work do not require appointments.  They are considered "Walk-In" services and can be obtained at either [facility] or [facility].  Blood work is performed at the Laboratory (Lab) from 08:00am - 04:00 pm (if applicable to your visit)

## 2023-09-19 ENCOUNTER — Ambulatory Visit: Admit: 2023-09-19 | Discharge: 2023-09-19 | Payer: BLUE CROSS/BLUE SHIELD | Attending: Otolaryngology | Primary: Family

## 2023-09-19 VITALS — BP 138/86 | HR 82 | Temp 97.30000°F | Resp 20 | Ht 69.0 in | Wt 298.0 lb

## 2023-09-19 DIAGNOSIS — R29818 Other symptoms and signs involving the nervous system: Secondary | ICD-10-CM

## 2023-09-19 NOTE — Assessment & Plan Note (Signed)
 Chronic, at goal (stable), continue current treatment plan, managed by PCP, takes Celexa , patient says her depression is under good control

## 2023-09-19 NOTE — Assessment & Plan Note (Signed)
 Chronic, not at goal (unstable), continue current plan pending work up below, 18/24, surprisingly high and I explained her that she may have more than 1 sleep disorder but first will need to evaluate for presumed obstructive sleep apnea.

## 2023-09-19 NOTE — Progress Notes (Signed)
 [facility]   Sleep Medicine     Office Progress Note - Initial Evaluation        [address]  [address]  [name]  [phone]  [phone] Fax    Reason for visit/referral: Evaluation for possible obstructive sleep apnea/sleep disordered breathing  Assessment:      1. Suspected sleep apnea  -     PAT - Home Sleep Test; Future  2. Snoring  3. Excessive daytime sleepiness  Assessment & Plan:   Chronic, not at goal (unstable), continue current plan pending work up below, 18/24, surprisingly high and I explained her that she may have more than 1 sleep disorder but first will need to evaluate for presumed obstructive sleep apnea.  Orders:  -     PAT - Home Sleep Test; Future  4. Class 3 severe obesity due to excess calories without serious comorbidity with body mass index (BMI) of 40.0 to 44.9 in adult (HCC)  5. Depression, unspecified depression type  Assessment & Plan:   Chronic, at goal (stable), continue current treatment plan, managed by PCP, takes Celexa , patient says her depression is under good control     Melanie Harvey is a 23 year old female with a history of depression, increased BMI and who complains of snoring, and excessive daytime sleepiness it has been going on for "years".    She sleeps the entire night and only maybe wakes up once briefly during the night and has to drag herself out of bed and it takes her an hour to be able to function.    She naps for sometimes 2 to 3 hours a day.    We discussed obstructive sleep apnea, idiopathic hypersomnia and narcolepsy today.    The first step is to evaluate for sleep disordered breathing and, in particular, due to the fact that her BMI is 44.  We discussed weight loss as well today and strategies to avoid falling asleep driving.    I have discussed the nature and definition of obstructive sleep apnea and why it would be important to evaluate for this.  We did discuss how undiagnosed/untreated obstructive  sleep apnea can affect other medical conditions/disorders, and in particular, cardiovascular disorders.  The consequences of untreated obstructive sleep apnea include the increased risk of stroke, heart disease, hypertension, cognitive difficulties (attention, memory), possible endocrine difficulties to include insulin resistance and possible increased risk of diabetes, increased sleepiness and fatigue and increased risk of motor vehicle accidents.    We discussed the different kinds of sleep studies and I believe a home sleep apnea test is a reasonable option in the setting.    We did discuss not driving sleepy and how weight gain/weight loss can affect obstructive sleep apnea.    Patient asked appropriate questions and would like to move forward with obtaining desired study.      Plan:   I will order a home sleep apnea test and message her with results.  We did discuss treatment options depending on degree and nature of obstructive sleep apnea.    This information was analyzed to assess complexity and medical decision making in regards to further testing and management.    Continue meds for: Depression. Pt would medically benefit from wt loss for OSA (diet, exercise, surgical).    Orders Placed This Encounter   Procedures    PAT - Home Sleep Test     Standing Status:   Future     Expected Date:  09/20/2023     Expiration Date:   09/18/2024     Scheduling Instructions:      23 year old female with a history of snoring, obesity and excessive daytime sleepiness with an Epworth sleep score of 18.  Ordering a home sleep apnea test to evaluate for suspected moderate to severe obstructive sleep apnea.     Location For Sleep Study:   [facility]        Potential consequences of untreated sleep apnea, and/or excessive daytime sleepiness were discussed with the patient.  Educational materials provided.  Treatment options including CPAP, dental appliance, weight reduction measures, positional therapy, surgeries etc were  at least briefly discussed.  Healthy lifestyle changes to include weight loss and exercise discussed.  Healthy sleep habits were reviewed and encouraged.  Driver and workplace safety reviewed and discussed as appropriate. Drowsy and/or inattentive driving should be avoided.  Follow up with Primary Care Provider (PCP) as directed and for routine health care maintenance.  Follow-up: 1-2 months (after sleep study complete), sooner should new symptoms or problems arise.  Thank you for allowing me to participate in the care of your patient!  Subjective:     Patient ID: Melanie Harvey is a 23 y.o. female.    Chief Complaint   Patient presents with    Sleep Problem    Snoring     HPI:      Melanie Harvey is a 23 y.o. female referred by Alleen Isle, AP* for a sleep evaluation. She complains of: "I am sleepy all the time".    The patient goes to bed at 8:30 PM and it takes her 15 minutes to fall asleep.  She might wake up once during the night and has no trouble getting back to sleep.  On her workdays, the alarm goes off at 415 and it takes her 15 minutes to get out of bed.  It takes her an hour to wake up and she feels "groggy".    On the weekends she will sleep until 8 or 10 AM but does not feel any better or less tired/sleepy.    She does snore but is not sure if she has any witnessed apneas.  She denies waking up choking or gasping for air.    She naps in the afternoon for sometimes 3 to 4 hours, gets up eats dinner and goes back to sleep for the night.    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  She would fall asleep in the car without question and fell asleep in class "all the time".    She denies sleep paralysis and hypnagogic hallucinations.    Previous sleep evaluation and treatment has included- None      Previous Report(s) Reviewed: historical medical records, office notes, and referral letter(s). Pertinent data has been documented.    Sleep  symptoms:  Yes No  Additional Comments   Snoring [x]  []     Witness Apneas []  [x]     Waking snorting/gasping []  [x]     Nocturia []     [x]     Legs kicking at night []  [x]     AM Headaches []  [x]     Non-restorative sleep [x]  []     Daytime sleepiness/fatigue [x]  []     Daytime napping [x]  []     Drowsiness while driving [x]  []  No MVAs   Memory issues [x]  []     Decreased concentration [x]  []     Cataplexy []  [x]   Hypnogogic hallucinations []  [x]     Sleep paralysis []  [x]     Bruxism []  [x]     "Clock Watching" []  [x]     Other []  []             09/19/2023     8:18 AM   Sleep Questionnaire   Sitting and reading 3   Watching TV 2   Sitting, inactive in a public place (e.g. a theatre or a meeting) 3   As a passenger in a car for an hour without a break 2   Lying down to rest in the afternoon when circumstances permit 3   Sitting and talking to someone 1   Sitting quietly after a lunch without alcohol 3   In a car, while stopped for a few minutes in traffic 1   Epworth Sleepiness Score 18        Patient-reported       Occupation/work hours: Works at [employer], 5 days out of 7 from 5:15 AM to 2 PM    Caffeine Intake -soda, maybe 1/day    Social History     Socioeconomic History    Marital status: Single     Spouse name: Not on file    Number of children: Not on file    Years of education: Not on file    Highest education level: Not on file   Occupational History    Not on file   Tobacco Use    Smoking status: Never     Passive exposure: Never    Smokeless tobacco: Never   Vaping Use    Vaping status: Never Used   Substance and Sexual Activity    Alcohol use: Never    Drug use: Never    Sexual activity: Never   Other Topics Concern    Not on file   Social History Narrative    Not on file     Social Drivers of Health     Financial Resource Strain: Medium Risk (02/28/2022)    Overall Financial Resource Strain (CARDIA)     Difficulty of Paying Living Expenses: Somewhat hard   Food Insecurity: No Food Insecurity (09/05/2023)    Hunger  Vital Sign     Worried About Running Out of Food in the Last Year: Never true     Ran Out of Food in the Last Year: Never true   Transportation Needs: No Transportation Needs (09/05/2023)    PRAPARE - Therapist, art (Medical): No     Lack of Transportation (Non-Medical): No   Physical Activity: Insufficiently Active (02/28/2022)    Exercise Vital Sign     Days of Exercise per Week: 2 days     Minutes of Exercise per Session: 60 min   Stress: Not on file   Social Connections: Not on file   Intimate Partner Violence: Not At Risk (02/28/2022)    Humiliation, Afraid, Rape, and Kick questionnaire     Fear of Current or Ex-Partner: No     Emotionally Abused: No     Physically Abused: No     Sexually Abused: No   Housing Stability: Low Risk  (09/05/2023)    Housing Stability Vital Sign     Unable to Pay for Housing in the Last Year: No     Number of Times Moved in the Last Year: 0     Homeless in the Last Year: No        Current Outpatient Medications  Medication Instructions    betamethasone dipropionate 0.05 % ointment 2 TIMES DAILY    citalopram  (CELEXA ) 20 mg, Oral, DAILY    Norgestim-Eth Estrad Triphasic (TRI-ESTARYLLA) 0.18/0.215/0.25 MG-35 MCG TABS TAKE 1 TABLET BY MOUTH EVERY DAY       Allergies as of 09/19/2023    (No Known Allergies)       Patient Active Problem List   Diagnosis    Class 3 severe obesity due to excess calories without serious comorbidity with body mass index (BMI) of 40.0 to 44.9 in adult [facility])    Depression    Excessive daytime sleepiness       Past Medical History:   Diagnosis Date    Anxiety     Depression        Past Surgical History:   Procedure Laterality Date    APPENDECTOMY  2021    TONSILLECTOMY  2017       Family History   Problem Relation Age of Onset    Cancer Father         on kidney, removed in 2020.    Diabetes Father         type 2         Objective:     Vitals:    Weight BMI   Wt Readings from Last 3 Encounters:   09/19/23 135.2 kg (298 lb)   09/05/23  134.9 kg (297 lb 6.4 oz)   07/31/22 129.9 kg (286 lb 6.4 oz)    Body mass index is 44.01 kg/m.     BP HR SaO2   BP Readings from Last 3 Encounters:   09/19/23 138/86   09/05/23 (!) 133/90   07/31/22 (!) 129/90    Pulse Readings from Last 3 Encounters:   09/19/23 82   09/05/23 79   07/31/22 93    SpO2 Readings from Last 3 Encounters:   09/19/23 97%   09/05/23 97%   07/31/22 98%        Physical Exam  Constitutional:       Appearance: Normal appearance. She is obese.   HENT:      Head: Normocephalic and atraumatic.   Eyes:      Extraocular Movements: Extraocular movements intact.      Conjunctiva/sclera: Conjunctivae normal.      Pupils: Pupils are equal, round, and reactive to light.   Cardiovascular:      Rate and Rhythm: Normal rate.   Pulmonary:      Effort: Pulmonary effort is normal.   Neurological:      General: No focal deficit present.      Mental Status: She is alert and oriented to person, place, and time.   Psychiatric:         Mood and Affect: Mood normal.         Behavior: Behavior normal.         The mandibular molar Class :   [x] 1 [] 2 [] 3        Mallampati I Airway Classification:   [] 1 [] 2 [x] 3 [] 4 tonsils absent      Data reviewed:    Hemoglobin A1C   Date Value Ref Range Status   09/05/2023 4.6 4.2 - 5.6 % Final     Comment:     (NOTE)  HbA1C Interpretive Ranges  <5.7              Normal  5.7 - 6.4         Consider Prediabetes  >6.5  Consider Diabetes          Lab Results   Component Value Date/Time    NA 136 09/05/2023 10:44 AM    K 4.5 09/05/2023 10:44 AM    CL 104 09/05/2023 10:44 AM    CO2 27 09/05/2023 10:44 AM    BUN 12 09/05/2023 10:44 AM    CREATININE 0.68 09/05/2023 10:44 AM    GLUCOSE 79 09/05/2023 10:44 AM    CALCIUM 9.5 09/05/2023 10:44 AM    LABGLOM >90 09/05/2023 10:44 AM    LABGLOM >60 02/28/2022 09:23 AM       Historical Sleep Testing Data: None     This note was dictated utilizing voice recognition software which may lead to typographical errors.  I apologize in advance  if the situation occurs.  If questions arise please do not hesitate to contact me or call our department.    Electronically signed by Laraine Plate, DO on4/30/2025 at 9:50 AM

## 2023-09-19 NOTE — Progress Notes (Signed)
 Melanie Harvey presents today for   Chief Complaint   Patient presents with    Sleep Problem    Snoring       Is someone accompanying this pt? no    Is the patient using any DME equipment during OV? no    -DME Company: N/A    Have you ever had a sleep study done before? no    Depression Screening:      09/19/2023     9:26 AM   PHQ-9    Little interest or pleasure in doing things 0   Feeling down, depressed, or hopeless 0   PHQ-2 Score 0   PHQ-9 Total Score 0        Epworth Sleepiness Scale:      09/19/2023     8:18 AM   Sleep Medicine   Sitting and reading 3   Watching TV 2   Sitting, inactive in a public place (e.g. a theatre or a meeting) 3   As a passenger in a car for an hour without a break 2   Lying down to rest in the afternoon when circumstances permit 3   Sitting and talking to someone 1   Sitting quietly after a lunch without alcohol 3   In a car, while stopped for a few minutes in traffic 1   Epworth Sleepiness Score 18    Neck (Inches) 15       Patient-reported       Stop-Bang:      09/19/2023     9:00 AM   STOP-BANG QUESTIONNAIRE   Are you a loud and/or regular snorer? 1   Do you often feel tired or groggy upon awakening or do you awaken with a headache? 1   Have you been observed to gasp or stop breathing during sleep? 0   Are you often tired or fatigued during wake time hours?  0   Do you fall asleep sitting, reading, watching TV or driving? 0   Do you often have problems with memory or concentration? 0   Do you have or are you being treated for high blood pressure? 0   Recent BMI (Calculated) 0   Age older than 24 years old? 0=No   Is your neck circumference greater than 17 inches (Female) or 16 inches (Female)? 0   Gender - Female 0=No         Coordination of Care:  1. Have you been to the ER, urgent care clinic since your last visit? Hospitalized since your last visit? no    2. Have you seen or consulted any other health care providers outside of the [facility] since your last visit?  Include any pap smears or colon screening. no    Medication list has been updated according to patient.

## 2023-10-16 ENCOUNTER — Inpatient Hospital Stay: Admit: 2023-10-16 | Discharge: 2023-10-16 | Payer: BLUE CROSS/BLUE SHIELD | Attending: Otolaryngology | Primary: Family

## 2023-10-16 DIAGNOSIS — R29818 Other symptoms and signs involving the nervous system: Secondary | ICD-10-CM

## 2023-10-25 ENCOUNTER — Encounter

## 2023-10-29 NOTE — Progress Notes (Signed)
 DME Company:   Order sent to [employer] (fax-email) 10-29-2023.

## 2024-01-09 ENCOUNTER — Ambulatory Visit: Admit: 2024-01-09 | Discharge: 2024-01-09 | Payer: BLUE CROSS/BLUE SHIELD | Attending: Otolaryngology | Primary: Family

## 2024-01-09 VITALS — BP 136/84 | HR 111 | Temp 98.60000°F | Resp 18 | Ht 69.0 in | Wt 305.6 lb

## 2024-01-09 DIAGNOSIS — G4733 Obstructive sleep apnea (adult) (pediatric): Principal | ICD-10-CM

## 2024-01-09 MED ORDER — MODAFINIL 100 MG PO TABS
100 | ORAL_TABLET | Freq: Every day | ORAL | 0 refills | 30.00000 days | Status: DC
Start: 2024-01-09 — End: 2024-02-26

## 2024-01-09 NOTE — Progress Notes (Signed)
 Melanie Harvey presents today for   Chief Complaint   Patient presents with    Follow-up     cpap       Is someone accompanying this pt? no    Is the patient using any DME equipment during OV? No     -DME Company [employer]     Depression Screening:      01/09/2024    11:00 AM   PHQ-9    Little interest or pleasure in doing things 0   Feeling down, depressed, or hopeless 0   PHQ-2 Score 0   PHQ-9 Total Score 0        Epworth Sleepiness Scale:      01/06/2024    10:51 AM   Sleep Medicine   Sitting and reading 3   Watching TV 3   Sitting, inactive in a public place (e.g. a theatre or a meeting) 3   As a passenger in a car for an hour without a break 2   Lying down to rest in the afternoon when circumstances permit 3   Sitting and talking to someone 1   Sitting quietly after a lunch without alcohol 2   In a car, while stopped for a few minutes in traffic 2   Epworth Sleepiness Score 19        Patient-reported       Stop-Bang:      09/19/2023     9:00 AM   STOP-BANG QUESTIONNAIRE   Are you a loud and/or regular snorer? 1   Do you often feel tired or groggy upon awakening or do you awaken with a headache? 1   Have you been observed to gasp or stop breathing during sleep? 0   Are you often tired or fatigued during wake time hours?  0   Do you fall asleep sitting, reading, watching TV or driving? 0   Do you often have problems with memory or concentration? 0   Do you have or are you being treated for high blood pressure? 0   Recent BMI (Calculated) 0   Age older than 24 years old? 0=No   Is your neck circumference greater than 17 inches (Female) or 16 inches (Female)? 0   Gender - Female 0=No        Data saved with a previous flowsheet row definition           Coordination of Care:  1. Have you been to the ER, urgent care clinic since your last visit? Hospitalized since your last visit?  no    2. Have you seen or consulted any other health care providers outside of the [facility] since your last visit? Include any  pap smears or colon screening. no    Medication list has been updated according to patient.

## 2024-01-09 NOTE — Assessment & Plan Note (Signed)
 Chronic, at goal (stable), continue current treatment plan, managed by PCP, takes Celexa , patient says her depression is under good control

## 2024-01-09 NOTE — Progress Notes (Signed)
 [facility]   Sleep Medicine     Office Progress Note        [address]  [address]  [place] [address]  [phone]  [phone] Fax    Reason for visit/referral:  f/u Obstructive Sleep Apnea with CPAP  Assessment:      1. OSA (obstructive sleep apnea)  Overview:  Watch pat study 10/16/23.  Diagnosis: Obstructive Sleep Apnea - Mild - AHI 8.2 (3%). REM AHI 27.5. O2 nadir - 85%. Time below 88% - 0.4  minutes. Total sleep time - 8:26.    Orders:  -     DME - DURABLE MEDICAL EQUIPMENT  -     modafinil (PROVIGIL) 100 MG tablet; Take 1 tablet by mouth daily for 90 days. Max Daily Amount: 100 mg, Disp-90 tablet, R-0Normal  2. Excessive daytime sleepiness  -     Polysomnography (95810); Future  -     MSLT . Multiple Sleep Latency Test [phone]); Future  -     modafinil (PROVIGIL) 100 MG tablet; Take 1 tablet by mouth daily for 90 days. Max Daily Amount: 100 mg, Disp-90 tablet, R-0Normal  3. Depression, unspecified depression type  Assessment & Plan:    Chronic, at goal (stable), continue current treatment plan, managed by PCP, takes Celexa , patient says her depression is under good control   4. 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    Patient has a history of mild obstructive sleep apnea diagnosed in May 2025.    She got her current CPAP machine November 10, 2023.  Compliance more than 4 hours usage is 95%.  Residual AHI is low at 0.5 and she is using machine about 8 hours per night on average.    Today she stated that she really does not like her CPAP machine but has been using it faithfully.  It is made 0 difference in her sleep quality or daytime sleepiness and her excessive daytime sleepiness actually gotten worse.  She actually became tearful during the visit because she was hopeful that using CPAP  would benefit her but it has not    In reference to her sleep complaints, previously she was snoring and was needing to nap for 2 to 3 hours a day.  With her current degree of excessive daytime somnolence, which is actually worse than it was when I first saw her, I think we need to move forward with a PSG/MSLT as she certainly could also have narcolepsy or idiopathic hypersomnia.    Prior discussion:  Melanie Harvey is a 23 year old female with a history of depression, increased BMI and who complains of snoring, and excessive daytime sleepiness it has been going on for years.    She sleeps the entire night and only maybe wakes up once briefly during the night and has to drag herself out of bed and it takes her an hour to be able to function.    She naps for sometimes 2 to 3 hours a day.    We discussed obstructive sleep apnea, idiopathic hypersomnia and narcolepsy today.    The first step is to evaluate for sleep disordered breathing and, in particular, due to the fact that her BMI is 44.  We discussed weight loss as well today and strategies to  avoid falling asleep driving.    I have discussed the nature and definition of obstructive sleep apnea and why it would be important to evaluate for this.  We did discuss how undiagnosed/untreated obstructive sleep apnea can affect other medical conditions/disorders, and in particular, cardiovascular disorders.  The consequences of untreated obstructive sleep apnea include the increased risk of stroke, heart disease, hypertension, cognitive difficulties (attention, memory), possible endocrine difficulties to include insulin resistance and possible increased risk of diabetes, increased sleepiness and fatigue and increased risk of motor vehicle accidents.    We discussed the different kinds of sleep studies and I believe a home sleep apnea test is a reasonable option in the setting.    We did discuss not driving sleepy and how weight gain/weight loss can affect  obstructive sleep apnea.    Patient asked appropriate questions and would like to move forward with obtaining desired study.      Plan:   I will place an Rx/order for CPAP supplies and we will continue the current settings on her machine as these appear to be appropriate, at least for now.  I think in the end we will be discontinuing her CPAP after we get through her PSG/MSLT.    I will go ahead and order a PSG/MSLT.  She will need to bring her CPAP machine and use it during at least the PSG portion of the study.  The reason for this is mainly because when she goes into REM her sleep apnea becomes moderate and close to severe with 28 events per hour.  I just do not want this to interfere with the results at all.     I gave her a 2-week sleep log and have asked her to complete at least a week of it ahead of her PSG/MSLT.    We discussed her Celexa  and I do not think discontinuing it would be reasonable as I do not want to precipitate worsening depression which can affect the outcome of the study as well.    I think a trial of modafinil would be helpful at this juncture but she will have to discontinue it a week before her PSG/MSLT.  Her level of excessive daytime sleepiness actually has me worried she may fall asleep driving and I am not sure how many weeks it will take to get her PSG/MSLT scheduled.  I will give her the lower dose of modafinil, 100 mg, and I did discuss possible side effects.    After the patient left I also noticed that she was taking OCPs still have to message her that there is a theoretical possible interaction between the 2 which could lower effectiveness of oral contraceptives.    I will have her follow-up after the PSG/MSLT and we will go over the results and come up with a regimen for her to start treating either her narcolepsy or idiopathic hypersomnia.    Prior plan:  I will order a home sleep apnea test and message her with results.  We did discuss treatment options depending on degree and  nature of obstructive sleep apnea.    This information was analyzed to assess complexity and medical decision making in regards to further testing and management.    Continue meds for: Depression. Pt would medically benefit from wt loss for OSA (diet, exercise, surgical).    Orders Placed This Encounter   Procedures    Polysomnography ([id number])     Standing Status:   Future     Expected Date:  01/09/2024     Expiration Date:   04/08/2024     Scheduling Instructions:      23 year old female with a history of snoring, obesity and excessive daytime sleepiness with an Epworth sleep score of 18.  Has mild obstructive sleep apnea that is well-controlled with obstructive sleep apnea and now with worsening excessive daytime sleepiness.  Suspect narcolepsy or idiopathic hypersomnia.  Needs PSG/MSLT.    MSLT . Multiple Sleep Latency Test [phone])     Standing Status:   Future     Expected Date:   01/09/2024     Expiration Date:   04/08/2024     Scheduling Instructions:      23 year old female with a history of snoring, obesity and excessive daytime sleepiness with an Epworth sleep score of 18.  Has mild obstructive sleep apnea that is well-controlled with obstructive sleep apnea and now with worsening excessive daytime sleepiness.  Suspect narcolepsy or idiopathic hypersomnia.  Needs PSG/MSLT.    DME - DURABLE MEDICAL EQUIPMENT     DME: [facility]  Dx: Obstructive Sleep Apnea   Requirement: Lifetime  Length of Need: Lifetime 99  Please renew all needed PAP supplies. This is to include heated hose, replacement filters. Fit mask for patient comfort. Allow patient to select mask of choice. Climate control per patient preference. Smart Start/Stop also per patient preference.     PLEASE ENSURE THAT PAP DEVICE IS TAGGED UNDER [facility]          Treatment options including CPAP, dental appliance, weight reduction measures, positional therapy, surgeries etc were at least briefly discussed.  Healthy  lifestyle changes to include weight loss and exercise discussed.  Healthy sleep habits were reviewed and encouraged.  Driver and workplace safety reviewed and discussed as appropriate. Drowsy and/or inattentive driving should be avoided.  Follow up with Primary Care Provider (PCP) as directed and for routine health care maintenance.  Follow-up: 1-2 months (after sleep study complete), sooner should new symptoms or problems arise.  Thank you for allowing me to participate in the care of your patient!  Subjective:     Patient ID: Melanie Harvey is a 23 y.o. female.    Chief Complaint   Patient presents with    Follow-up     cpap     HPI:      Kamali Sakata is a 23 y.o. female referred by No ref. provider found for a sleep evaluation. She complains of: I am sleepy all the time.    The patient goes to bed at 8:30 PM and it takes her 15 minutes to fall asleep.  She might wake up once during the night and has no trouble getting back to sleep.  On her workdays, the alarm goes off at 415 and it takes her 15 minutes to get out of bed.  It takes her an hour to wake up and she feels groggy.    On the weekends she will sleep until 8 or 10 AM but does not feel any better or less tired/sleepy.    She does snore but is not sure if she has any witnessed apneas.  She denies waking up choking or gasping for air.    She naps in the afternoon for sometimes 3 to 4 hours, gets up eats dinner and goes back to sleep for the night.    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  She would fall asleep  in the car without question and fell asleep in class all the time.    She denies sleep paralysis and hypnagogic hallucinations.    Previous sleep evaluation and treatment has included- None      Previous Report(s) Reviewed: historical medical records, office notes, and referral letter(s). Pertinent data has been documented.    Sleep symptoms:  Yes No  Additional Comments    Snoring [x]  []     Witness Apneas []  [x]     Waking snorting/gasping []  [x]     Nocturia []     [x]     Legs kicking at night []  [x]     AM Headaches []  [x]     Non-restorative sleep [x]  []     Daytime sleepiness/fatigue [x]  []     Daytime napping [x]  []     Drowsiness while driving [x]  []  No MVAs   Memory issues [x]  []     Decreased concentration [x]  []     Cataplexy []  [x]     Hypnogogic hallucinations []  [x]     Sleep paralysis []  [x]     Bruxism []  [x]     Clock Watching []  [x]     Other []  []             01/06/2024    10:51 AM 09/19/2023     8:18 AM   Sleep Questionnaire   Sitting and reading 3 3   Watching TV 3 2   Sitting, inactive in a public place (e.g. a theatre or a meeting) 3 3   As a passenger in a car for an hour without a break 2 2   Lying down to rest in the afternoon when circumstances permit 3 3   Sitting and talking to someone 1 1   Sitting quietly after a lunch without alcohol 2 3   In a car, while stopped for a few minutes in traffic 2 1   Epworth Sleepiness Score 19  18        Patient-reported       Occupation/work hours: Works at [employer], 5 days out of 7 from 5:15 AM to 2 PM    Caffeine Intake -soda, maybe 1/day    Social History     Socioeconomic History    Marital status: Single     Spouse name: Not on file    Number of children: Not on file    Years of education: Not on file    Highest education level: Not on file   Occupational History    Not on file   Tobacco Use    Smoking status: Never     Passive exposure: Never    Smokeless tobacco: Never   Vaping Use    Vaping status: Never Used   Substance and Sexual Activity    Alcohol use: Never    Drug use: Never    Sexual activity: Never   Other Topics Concern    Not on file   Social History Narrative    Not on file     Social Drivers of Health     Financial Resource Strain: Medium Risk (02/28/2022)    Overall Financial Resource Strain (CARDIA)     Difficulty of Paying Living Expenses: Somewhat hard   Food Insecurity: Not on file (09/05/2023)    Transportation Needs: No Transportation Needs (09/05/2023)    PRAPARE - Therapist, art (Medical): No     Lack of Transportation (Non-Medical): No   Physical Activity: Insufficiently Active (02/28/2022)    Exercise Vital Sign  Days of Exercise per Week: 2 days     Minutes of Exercise per Session: 60 min   Stress: Not on file   Social Connections: Not on file   Intimate Partner Violence: Not At Risk (02/28/2022)    Humiliation, Afraid, Rape, and Kick questionnaire     Fear of Current or Ex-Partner: No     Emotionally Abused: No     Physically Abused: No     Sexually Abused: No   Housing Stability: Low Risk  (09/05/2023)    Housing Stability Vital Sign     Unable to Pay for Housing in the Last Year: No     Number of Times Moved in the Last Year: 0     Homeless in the Last Year: No        Current Outpatient Medications   Medication Instructions    betamethasone dipropionate 0.05 % ointment 2 TIMES DAILY    citalopram  (CELEXA ) 20 mg, Oral, DAILY    modafinil (PROVIGIL) 100 mg, Oral, DAILY    Norgestim-Eth Estrad Triphasic (TRI-ESTARYLLA) 0.18/0.215/0.25 MG-35 MCG TABS TAKE 1 TABLET BY MOUTH EVERY DAY       Allergies as of 01/09/2024    (No Known Allergies)       Patient Active Problem List   Diagnosis    Class 3 severe obesity due to excess calories without serious comorbidity with body mass index (BMI) of 40.0 to 44.9 in adult [facility])    Depression    Excessive daytime sleepiness    OSA (obstructive sleep apnea)       Past Medical History:   Diagnosis Date    Anxiety     Depression        Past Surgical History:   Procedure Laterality Date    APPENDECTOMY  2021    TONSILLECTOMY  2017       Family History   Problem Relation Age of Onset    Cancer Father         on kidney, removed in 2020.    Diabetes Father         type 2         Objective:     Vitals:    Weight BMI   Wt Readings from Last 3 Encounters:   01/09/24 (!) 138.6 kg (305 lb 9.6 oz)   09/19/23 135.2 kg (298 lb)   09/05/23 134.9 kg  (297 lb 6.4 oz)    Body mass index is 45.13 kg/m.     BP HR SaO2   BP Readings from Last 3 Encounters:   01/09/24 136/84   09/19/23 138/86   09/05/23 (!) 133/90    Pulse Readings from Last 3 Encounters:   01/09/24 (!) 111   09/19/23 82   09/05/23 79    SpO2 Readings from Last 3 Encounters:   01/09/24 98%   09/19/23 97%   09/05/23 97%        Physical Exam  Constitutional:       Appearance: Normal appearance. She is obese.   HENT:      Head: Normocephalic and atraumatic.   Eyes:      Extraocular Movements: Extraocular movements intact.      Conjunctiva/sclera: Conjunctivae normal.      Pupils: Pupils are equal, round, and reactive to light.   Cardiovascular:      Rate and Rhythm: Normal rate.   Pulmonary:      Effort: Pulmonary effort is normal.   Neurological:  General: No focal deficit present.      Mental Status: She is alert and oriented to person, place, and time.   Psychiatric:         Mood and Affect: Mood normal.         Behavior: Behavior normal.         The mandibular molar Class :   [x] 1 [] 2 [] 3  (from initial exam)        Mallampati I Airway Classification:   [] 1 [] 2 [x] 3 [] 4 tonsils absent      Data reviewed:    Hemoglobin A1C   Date Value Ref Range Status   09/05/2023 4.6 4.2 - 5.6 % Final     Comment:     (NOTE)  HbA1C Interpretive Ranges  <5.7              Normal  5.7 - 6.4         Consider Prediabetes  >6.5              Consider Diabetes          Lab Results   Component Value Date/Time    NA 136 09/05/2023 10:44 AM    K 4.5 09/05/2023 10:44 AM    CL 104 09/05/2023 10:44 AM    CO2 27 09/05/2023 10:44 AM    BUN 12 09/05/2023 10:44 AM    CREATININE 0.68 09/05/2023 10:44 AM    GLUCOSE 79 09/05/2023 10:44 AM    CALCIUM 9.5 09/05/2023 10:44 AM    LABGLOM >90 09/05/2023 10:44 AM    LABGLOM >60 02/28/2022 09:23 AM       Historical Sleep Testing Data:   Watch pat study 10/16/23  Diagnosis: Obstructive Sleep Apnea - Mild - AHI 8.2 (3%). REM AHI 27.5. O2 nadir - 85%. Time below 88% - 0.4 minutes. Total sleep  time - 8:26.    Positive Airway Pressure (PAP) Compliance  Report & Summary Trends  DME: [facility]  11/10/23  Date >4 hr use % Time  (Total Time%) AHI PAP Rx Pressure @ 95% Median Use Time Leak-Median  (95%) Notes   11/10/23-01/07/24 95% 0.5 5-15, EPR 1 7.1 7:57 0.3                                     This note was dictated utilizing voice recognition software which may lead to typographical errors.  I apologize in advance if the situation occurs.  If questions arise please do not hesitate to contact me or call our department.    Electronically signed by Nat Legions, DO on8/20/2025 at 11:44 AM

## 2024-01-09 NOTE — Patient Instructions (Signed)
 Please make a follow up appointment to discuss the results of your sleep study or I will send you a "my chart" message with your results and treatment options.     The [facility] is located in the [facility], adjacent to [facility]. The lab is on the second floor. The direct number to call for sleep study related questions is: [phone].    Please call our clinic back at [phone] or send a message on MyChart if you have any questions or concerns or if you are experiencing any of the following:     You have not received a follow up appointment within 30 days prior the recommended follow up time.    If you are not tolerating treatment plan and/or not able to obtain equipment or prescribed medication(s).  if you are experiencing any difficulties with the Durable Medical Equipment  (DME) Company you may be using or is assigned to you.  Two weeks have passed and you have not received an appointment for a scheduled procedure.  Two weeks have passed since you underwent a test and/or procedure and you have not received your results.  Your  Durable Medical Equipment (DME ) company is supposed to provide you with replacement filters, tubing and masks. You can either call your DME when you need new supplies or you can arrange for an automatic shipment schedule.    Your need to be seen by our office at lat minimum of every 12 months in order to renew the prescription for these supplies.   Please make note of who your DME company is and their phone number.   Please make sure that you clean your mask and hosing on a regular basis.  Your DME can provide you with additional information regarding proper care and cleaning of your device    Driver Safety is strongly encouraged.  You should not drive if sleepy, tired, distracted and/or fatigued.      Chest Xrays and blood work do not require appointments.  They are considered "Walk-In" services and can be obtained at either [facility] or [facility].  Blood work is performed at the Laboratory (Lab) from 08:00am - 04:00 pm (if applicable to your visit)

## 2024-01-11 NOTE — Progress Notes (Signed)
 PAP supplies order sent to [facility] (fax-email) 01-10-2024.

## 2024-01-15 NOTE — Telephone Encounter (Signed)
 Per Dr. Donah, advised patient to stop taking her Modafinil 1 week prior to sleep study.

## 2024-01-30 DIAGNOSIS — G4719 Other hypersomnia: Secondary | ICD-10-CM

## 2024-01-30 NOTE — Progress Notes (Signed)
 Patient arrived for sleep study  Physician Orders were reviewed.  Patient questions were answered.  Patient education to include initiation of PAP therapy per protocol.  Patient demonstrated good understanding of the positive airway pressure device.

## 2024-01-30 NOTE — Progress Notes (Signed)
 Sleep study completed per physician order.  Patient discharged from sleep center.  Patient wake, alert, and able to leave the facility under their own power.  Instructed to follow up with their sleep physician for results.

## 2024-01-31 ENCOUNTER — Inpatient Hospital Stay: Admit: 2024-01-31 | Discharge: 2024-01-31 | Payer: BLUE CROSS/BLUE SHIELD | Attending: Otolaryngology | Primary: Family

## 2024-01-31 ENCOUNTER — Inpatient Hospital Stay: Admit: 2024-01-31 | Discharge: 2024-02-04 | Payer: BLUE CROSS/BLUE SHIELD | Attending: Otolaryngology | Primary: Family

## 2024-01-31 VITALS — BP 130/86 | HR 65 | Ht 69.0 in | Wt 309.0 lb

## 2024-01-31 DIAGNOSIS — G4719 Other hypersomnia: Principal | ICD-10-CM

## 2024-01-31 LAB — URINE DRUG SCREEN
Amphetamine, Urine: NEGATIVE
Barbiturates, Urine: NEGATIVE
Benzodiazepines, Urine: NEGATIVE
Cocaine, Urine: NEGATIVE
Fentanyl: NEGATIVE
Methadone, Urine: NEGATIVE
Opiates, Urine: NEGATIVE
Oxycodone Urine: NEGATIVE
THC, TH-Cannabinol, Urine: NEGATIVE

## 2024-01-31 NOTE — Progress Notes (Signed)
 Sleep Study completed per physician order.  Patient discharged from sleep center.   Patient awake, alert and able to leave the facility under their own power.   Instructed to follow up with their sleep physician for results.

## 2024-01-31 NOTE — Progress Notes (Signed)
 Patient arrived for sleep study.  Physician orders were reviewed.   Patient education to include initiation of PAP therapy per protocol.  Patient questions were answered.  The patient demonstrated good understanding of the positive airway pressure device.

## 2024-02-21 ENCOUNTER — Ambulatory Visit: Admit: 2024-02-21 | Discharge: 2024-02-21 | Payer: BLUE CROSS/BLUE SHIELD | Attending: Otolaryngology | Primary: Family

## 2024-02-21 VITALS — BP 140/90 | HR 83 | Temp 96.80000°F | Resp 19 | Ht 69.0 in | Wt 310.0 lb

## 2024-02-21 DIAGNOSIS — G4733 Obstructive sleep apnea (adult) (pediatric): Principal | ICD-10-CM

## 2024-02-21 MED ORDER — SOLRIAMFETOL HCL 150 MG PO TABS
150 | ORAL_TABLET | Freq: Every day | ORAL | 1 refills | Status: DC
Start: 2024-02-21 — End: 2024-05-06

## 2024-02-21 NOTE — Assessment & Plan Note (Addendum)
 Chronic, not at goal, unstable.  This is not due to a sleep disorder and after a thorough sleep workup, she has mild obstructive sleep apnea but does not have narcolepsy or idiopathic hypersomnia.    She is not really getting any benefit from CPAP and Modafinil  has not helped her feel less sleepy during the day.  This is why I think this is more likely to be metabolic/medical issue such as an endocrinology disorder as we discussed today in the clinic.  Interestingly, her fianc was just diagnosed with Cushing's disease.    She needs to make a follow-up appointment to see her PCP for ongoing workup of her excessive daytime somnolence.    In the interim, I will try prescribing her Sunosi to see if that helps with the excessive daytime somnolence but I think she needs ongoing workup.

## 2024-02-21 NOTE — Progress Notes (Signed)
 Melanie Harvey presents today for   Chief Complaint   Patient presents with    Follow-up    Results       Is someone accompanying this pt? no    Is the patient using any DME equipment during OV? no    -DME Company: N/A    Depression Screening:      02/21/2024     3:15 PM   PHQ-9    Little interest or pleasure in doing things 0   Feeling down, depressed, or hopeless 0   PHQ-2 Score 0   PHQ-9 Total Score 0        Epworth Sleepiness Scale:      02/20/2024     7:51 PM   Sleep Medicine   Sitting and reading 3   Watching TV 2   Sitting, inactive in a public place (e.g. a theatre or a meeting) 3   As a passenger in a car for an hour without a break 2   Lying down to rest in the afternoon when circumstances permit 3   Sitting and talking to someone 1   Sitting quietly after a lunch without alcohol 2   In a car, while stopped for a few minutes in traffic 2   Epworth Sleepiness Score 18        Patient-reported       Stop-Bang:      09/19/2023     9:00 AM   STOP-BANG QUESTIONNAIRE   Are you a loud and/or regular snorer? 1   Do you often feel tired or groggy upon awakening or do you awaken with a headache? 1   Have you been observed to gasp or stop breathing during sleep? 0   Are you often tired or fatigued during wake time hours?  0   Do you fall asleep sitting, reading, watching TV or driving? 0   Do you often have problems with memory or concentration? 0   Do you have or are you being treated for high blood pressure? 0   Recent BMI (Calculated) 0   Age older than 24 years old? 0=No   Is your neck circumference greater than 17 inches (Female) or 16 inches (Female)? 0   Gender - Female 0=No        Data saved with a previous flowsheet row definition           Coordination of Care:  1. Have you been to the ER, urgent care clinic since your last visit? Hospitalized since your last visit?  no    2. Have you seen or consulted any other health care providers outside of the [facility] since your last visit? Include any  pap smears or colon screening. no    Medication list has been updated according to patient.

## 2024-02-21 NOTE — Assessment & Plan Note (Signed)
 Chronic, at goal (stable), continue current treatment plan, managed by PCP, takes Celexa , patient says her depression is under good control

## 2024-02-21 NOTE — Progress Notes (Signed)
 [facility], [facility]   Sleep Medicine     Office Progress Note        [address]  [address]  [place] [address]  [phone]  [phone] Fax    Reason for visit/referral:  f/u Obstructive Sleep Apnea with CPAP  Assessment:      1. OSA (obstructive sleep apnea)  Assessment & Plan:   Chronic, at goal (stable), continue current treatment plan until further etiology for excessive daytime somnolence is determined.    Mild in nature, certainly not likely to be the etiology of her degree of excessive daytime sleepiness  Orders:  -     Solriamfetol HCl 150 MG TABS; Take 1 tablet by mouth daily Max Daily Amount: 1 tablet, Disp-30 tablet, R-1Normal  2. Excessive daytime sleepiness  Assessment & Plan:   Chronic, not at goal, unstable.  This is not due to a sleep disorder and after a thorough sleep workup, she has mild obstructive sleep apnea but does not have narcolepsy or idiopathic hypersomnia.    She is not really getting any benefit from CPAP and Modafinil  has not helped her feel less sleepy during the day.  This is why I think this is more likely to be metabolic/medical issue such as an endocrinology disorder as we discussed today in the clinic.  Interestingly, her fianc was just diagnosed with Cushing's disease.    She needs to make a follow-up appointment to see her PCP for ongoing workup of her excessive daytime somnolence.    In the interim, I will try prescribing her Sunosi to see if that helps with the excessive daytime somnolence but I think she needs ongoing workup.  Orders:  -     Solriamfetol HCl 150 MG TABS; Take 1 tablet by mouth daily Max Daily Amount: 1 tablet, Disp-30 tablet, R-1Normal  3. Depression, unspecified depression type  Assessment & Plan:   Chronic, at goal (stable), continue current treatment plan, managed by PCP, takes Celexa , patient says her depression is under good control   4. Class 3 severe obesity due to excess calories without serious comorbidity with body  mass index (BMI) of 40.0 to 44.9 in adult [name]    Last seen in office on 01/09/24, prior to that on 09/19/23.    Since the last visit, the patient had a PSG/MSLT.  Her PSG did not demonstrate any pathology at all.  Her sleep latency was 7 minutes and her sleep efficiency was high at 96%.    Her mean sleep latency on her MSLT was 9.9 minutes, not indicative of idiopathic hypersomnia or narcolepsy.    She also stated today that the Modafinil  does not help her very much at all.    She continues to be very compliant with CPAP, using her machine between 8 and 9 hours per night on average.  Her residual AHI is low at 0.5.    She did do a sleep diary for about a week and a half.  It appears that she is sleeping around 7 hours per night and then on the night before a day off she will sleep for 11 hours, suggesting she needs far more sleep than she is getting on every other night of the week.  Even when she sleeps 11 hours she naps 2 hours during the day the next day which is unusual.    My impression now is that this is not a primary sleep disorder and at this point, she should return to  her PCP for more thorough evaluation for things such as anemia, hypothyroidism, ongoing/worsening depression or even something such as an endocrine disorder (hypercortisolism) excetra.    For now, we may first want to do 2 things: Try extending her sleep hours during the workweek is much as possible, if able, and possibly trying Sunosi, 150 mg (instead of Modafinil ) for a few weeks to see if that combination helps.    She needs to f/u with her PCP at this point.  Assessment & Plan  1. Sleep Apnea.  Her sleep study results do not indicate narcolepsy or idiopathic hypersomnia, but rather mild sleep apnea. The current treatment with modafinil  has proven ineffective. A prescription for Sunosi 150 mg will be provided, with instructions to halve the dose and take it once daily for a few days to monitor for potential side effects such as  headaches.    2. Depression.  She reports that her depression is well-managed with Celexa  and does not believe it is contributing to her sleep issues. She is currently on a low dose of Celexa  and does not report any suicidal ideation or daily crying.      Prior discussion:  Patient has a history of mild obstructive sleep apnea diagnosed in May 2025.    She got her current CPAP machine November 10, 2023.  Compliance more than 4 hours usage is 95%.  Residual AHI is low at 0.5 and she is using machine about 8 hours per night on average.    Today she stated that she really does not like her CPAP machine but has been using it faithfully.  It is made 0 difference in her sleep quality or daytime sleepiness and her excessive daytime sleepiness actually gotten worse.  She actually became tearful during the visit because she was hopeful that using CPAP would benefit her but it has not    In reference to her sleep complaints, previously she was snoring and was needing to nap for 2 to 3 hours a day.  With her current degree of excessive daytime somnolence, which is actually worse than it was when I first saw her, I think we need to move forward with a PSG/MSLT as she certainly could also have narcolepsy or idiopathic hypersomnia.    Prior discussion:  Melanie Harvey is a 23 year old female with a history of depression, increased BMI and who complains of snoring, and excessive daytime sleepiness it has been going on for years.    She sleeps the entire night and only maybe wakes up once briefly during the night and has to drag herself out of bed and it takes her an hour to be able to function.    She naps for sometimes 2 to 3 hours a day.    We discussed obstructive sleep apnea, idiopathic hypersomnia and narcolepsy today.    The first step is to evaluate for sleep disordered breathing and, in particular, due to the fact that her BMI is 44.  We discussed weight loss as well today and strategies to avoid falling asleep  driving.    I have discussed the nature and definition of obstructive sleep apnea and why it would be important to evaluate for this.  We did discuss how undiagnosed/untreated obstructive sleep apnea can affect other medical conditions/disorders, and in particular, cardiovascular disorders.  The consequences of untreated obstructive sleep apnea include the increased risk of stroke, heart disease, hypertension, cognitive difficulties (attention, memory), possible endocrine difficulties to include insulin resistance and possible increased risk  of diabetes, increased sleepiness and fatigue and increased risk of motor vehicle accidents.    We discussed the different kinds of sleep studies and I believe a home sleep apnea test is a reasonable option in the setting.    We did discuss not driving sleepy and how weight gain/weight loss can affect obstructive sleep apnea.    Patient asked appropriate questions and would like to move forward with obtaining desired study.      Plan:     As above    Prior plan:  I will place an Rx/order for CPAP supplies and we will continue the current settings on her machine as these appear to be appropriate, at least for now.  I think in the end we will be discontinuing her CPAP after we get through her PSG/MSLT.    I will go ahead and order a PSG/MSLT.  She will need to bring her CPAP machine and use it during at least the PSG portion of the study.  The reason for this is mainly because when she goes into REM her sleep apnea becomes moderate and close to severe with 28 events per hour.  I just do not want this to interfere with the results at all.     I gave her a 2-week sleep log and have asked her to complete at least a week of it ahead of her PSG/MSLT.    We discussed her Celexa  and I do not think discontinuing it would be reasonable as I do not want to precipitate worsening depression which can affect the outcome of the study as well.    I think a trial of modafinil  would be helpful at  this juncture but she will have to discontinue it a week before her PSG/MSLT.  Her level of excessive daytime sleepiness actually has me worried she may fall asleep driving and I am not sure how many weeks it will take to get her PSG/MSLT scheduled.  I will give her the lower dose of modafinil , 100 mg, and I did discuss possible side effects.    After the patient left I also noticed that she was taking OCPs still have to message her that there is a theoretical possible interaction between the 2 which could lower effectiveness of oral contraceptives.    I will have her follow-up after the PSG/MSLT and we will go over the results and come up with a regimen for her to start treating either her narcolepsy or idiopathic hypersomnia.    Prior plan:  I will order a home sleep apnea test and message her with results.  We did discuss treatment options depending on degree and nature of obstructive sleep apnea.    This information was analyzed to assess complexity and medical decision making in regards to further testing and management.    Continue meds for: Depression. Pt would medically benefit from wt loss for OSA (diet, exercise, surgical).    No orders of the defined types were placed in this encounter.         Treatment options including CPAP, dental appliance, weight reduction measures, positional therapy, surgeries etc were at least briefly discussed.  Healthy lifestyle changes to include weight loss and exercise discussed.  Healthy sleep habits were reviewed and encouraged.  Driver and workplace safety reviewed and discussed as appropriate. Drowsy and/or inattentive driving should be avoided.  Follow up with Primary Care Provider (PCP) as directed and for routine health care maintenance.  Follow-up: 1-2 months (after sleep study complete),  sooner should new symptoms or problems arise.  Thank you for allowing me to participate in the care of your patient!  Subjective:     Patient ID: Melanie Harvey is a 23 y.o.  female.    Chief Complaint   Patient presents with    Follow-up    Results     HPI:      Melanie Harvey is a 23 y.o. female referred by No ref. provider found for a sleep evaluation. She complains of: I am sleepy all the time.    The patient goes to bed at 8:30 PM and it takes her 15 minutes to fall asleep.  She might wake up once during the night and has no trouble getting back to sleep.  On her workdays, the alarm goes off at 4:15 and it takes her 15 minutes to get out of bed.  It takes her an hour to wake up and she feels groggy.    On the weekends she will sleep until 8 or 10 AM but does not feel any better or less tired/sleepy.    She does snore but is not sure if she has any witnessed apneas.  She denies waking up choking or gasping for air.    She naps in the afternoon for sometimes 3 to 4 hours, gets up eats dinner and goes back to sleep for the night.    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  She would fall asleep in the car without question and fell asleep in class all the time.    She denies sleep paralysis and hypnagogic hallucinations.    Previous sleep evaluation and treatment has included- None    Previous Report(s) Reviewed: historical medical records, office notes, and referral letter(s). Pertinent data has been documented.    Sleep symptoms:  Yes No  Additional Comments   Snoring [x]  []     Witness Apneas []  [x]     Waking snorting/gasping []  [x]     Nocturia []     [x]     Legs kicking at night []  [x]     AM Headaches []  [x]     Non-restorative sleep [x]  []     Daytime sleepiness/fatigue [x]  []     Daytime napping [x]  []     Drowsiness while driving [x]  []  No MVAs   Memory issues [x]  []     Decreased concentration [x]  []     Cataplexy []  [x]     Hypnogogic hallucinations []  [x]     Sleep paralysis []  [x]     Bruxism []  [x]     Clock Watching []  [x]     Other []  []       Additional HPI 10/225:  History of Present Illness  The patient is a  23 year old female who presents for follow-up of her PSG/MSLT study.    She reports no improvement in her sleep quality with the use of CPAP. Modafinil  was initially mildly effective, but its benefits have diminished over time.     She estimates that she requires approximately 10 hours of sleep daily, which she finds challenging to achieve due to her work schedule. Despite adequate sleep, she often feels the need to nap during the day.     She does not typically fall asleep during conversations but admits to dozing off while reading or watching TV.     She also mentions a recent incident where she fell asleep while driving to [place] , a 4-hour drive, and is unsure if this was due to  fatigue or another cause.     She has been experiencing excessive sleepiness since middle school, which has persisted into her high school years.     She recalls being overweight as a child and weighing around 170 pounds in high school when she was active in sports.     She has irregular menstrual cycles, which are managed with birth control pills.Without birth control, her periods last for 7 days and are accompanied by cramps. She does not have a history of anemia.    She was diagnosed with depression in 2021 and has been managing it with Celexa . She reports that her depression is well-controlled and does not believe it is contributing to her sleep issues.    FAMILY HISTORY  Her father had kidney cancer removed a couple of years ago. Her father also has the same sleep problem but works night shifts and switches between day and night sleep. There is no family history of endocrinology problems that she is aware of.    MEDICATIONS  Current: modafinil , Celexa           02/20/2024     7:51 PM 01/30/2024     9:00 PM 01/06/2024    10:51 AM 09/19/2023     8:18 AM   Sleep Questionnaire   Sitting and reading 3 3 3 3    Watching TV 2 3 3 2    Sitting, inactive in a public place (e.g. a theatre or a meeting) 3 3 3 3    As a passenger in a car for an  hour without a break 2 3 2 2    Lying down to rest in the afternoon when circumstances permit 3 3 3 3    Sitting and talking to someone 1 2 1 1    Sitting quietly after a lunch without alcohol 2 3 2 3    In a car, while stopped for a few minutes in traffic 2 2 2 1    Epworth Sleepiness Score 18  22 19  18         Patient-reported       Occupation/work hours: Works at [employer], 5 days out of 7 from 5:15 AM to 2 PM    Caffeine Intake -soda, maybe 1/day    Social History     Socioeconomic History    Marital status: Single     Spouse name: Not on file    Number of children: Not on file    Years of education: Not on file    Highest education level: Not on file   Occupational History    Not on file   Tobacco Use    Smoking status: Never     Passive exposure: Never    Smokeless tobacco: Never   Vaping Use    Vaping status: Never Used   Substance and Sexual Activity    Alcohol use: Never    Drug use: Never    Sexual activity: Never   Other Topics Concern    Not on file   Social History Narrative    Not on file     Social Drivers of Health     Financial Resource Strain: Medium Risk (02/28/2022)    Overall Financial Resource Strain (CARDIA)     Difficulty of Paying Living Expenses: Somewhat hard   Food Insecurity: No Food Insecurity (09/05/2023)    Hunger Vital Sign     Worried About Running Out of Food in the Last Year: Never true     Ran Out of Food in the Last  Year: Never true   Transportation Needs: No Transportation Needs (09/05/2023)    PRAPARE - Therapist, art (Medical): No     Lack of Transportation (Non-Medical): No   Physical Activity: Insufficiently Active (02/28/2022)    Exercise Vital Sign     Days of Exercise per Week: 2 days     Minutes of Exercise per Session: 60 min   Stress: Not on file   Social Connections: Not on file   Intimate Partner Violence: Not At Risk (02/28/2022)    Humiliation, Afraid, Rape, and Kick questionnaire     Fear of Current or Ex-Partner: No     Emotionally Abused:  No     Physically Abused: No     Sexually Abused: No   Housing Stability: Low Risk  (09/05/2023)    Housing Stability Vital Sign     Unable to Pay for Housing in the Last Year: No     Number of Times Moved in the Last Year: 0     Homeless in the Last Year: No        Current Outpatient Medications   Medication Instructions    betamethasone dipropionate 0.05 % ointment 2 TIMES DAILY    citalopram  (CELEXA ) 20 mg, Oral, DAILY    modafinil  (PROVIGIL ) 100 mg, Oral, DAILY    Norgestim-Eth Estrad Triphasic (TRI-ESTARYLLA) 0.18/0.215/0.25 MG-35 MCG TABS TAKE 1 TABLET BY MOUTH EVERY DAY    Solriamfetol HCl 150 MG TABS 1 tablet, Oral, DAILY       Allergies as of 02/21/2024    (No Known Allergies)       Patient Active Problem List   Diagnosis    Class 3 severe obesity due to excess calories without serious comorbidity with body mass index (BMI) of 40.0 to 44.9 in adult [facility])    Depression    Excessive daytime sleepiness    OSA (obstructive sleep apnea)       Past Medical History:   Diagnosis Date    Anxiety     Depression        Past Surgical History:   Procedure Laterality Date    APPENDECTOMY  2021    TONSILLECTOMY  2017       Family History   Problem Relation Age of Onset    Cancer Father         on kidney, removed in 2020.    Diabetes Father         type 2         Objective:     Vitals:    Weight BMI   Wt Readings from Last 3 Encounters:   02/21/24 (!) 140.6 kg (310 lb)   01/30/24 (!) 140.2 kg (309 lb)   01/09/24 (!) 138.6 kg (305 lb 9.6 oz)    Body mass index is 45.78 kg/m.     BP HR SaO2   BP Readings from Last 3 Encounters:   02/21/24 (!) 140/90   01/31/24 130/86   01/09/24 136/84    Pulse Readings from Last 3 Encounters:   02/21/24 83   01/31/24 65   01/09/24 (!) 111    SpO2 Readings from Last 3 Encounters:   02/21/24 98%   01/09/24 98%   09/19/23 97%        Physical Exam  Constitutional:       Appearance: Normal appearance. She is obese.   HENT:      Head: Normocephalic and atraumatic.   Eyes:  Extraocular  Movements: Extraocular movements intact.      Conjunctiva/sclera: Conjunctivae normal.      Pupils: Pupils are equal, round, and reactive to light.   Cardiovascular:      Rate and Rhythm: Normal rate.   Pulmonary:      Effort: Pulmonary effort is normal.   Neurological:      General: No focal deficit present.      Mental Status: She is alert and oriented to person, place, and time.   Psychiatric:         Mood and Affect: Mood normal.         Behavior: Behavior normal.         The mandibular molar Class :   [x] 1 [] 2 [] 3  (from initial exam)        Mallampati I Airway Classification:   [] 1 [] 2 [x] 3 [] 4 tonsils absent      Data reviewed:    Hemoglobin A1C   Date Value Ref Range Status   09/05/2023 4.6 4.2 - 5.6 % Final     Comment:     (NOTE)  HbA1C Interpretive Ranges  <5.7              Normal  5.7 - 6.4         Consider Prediabetes  >6.5              Consider Diabetes          Lab Results   Component Value Date/Time    NA 136 09/05/2023 10:44 AM    K 4.5 09/05/2023 10:44 AM    CL 104 09/05/2023 10:44 AM    CO2 27 09/05/2023 10:44 AM    BUN 12 09/05/2023 10:44 AM    CREATININE 0.68 09/05/2023 10:44 AM    GLUCOSE 79 09/05/2023 10:44 AM    CALCIUM 9.5 09/05/2023 10:44 AM    LABGLOM >90 09/05/2023 10:44 AM    LABGLOM >60 02/28/2022 09:23 AM       Lab Results   Component Value Date    TSH 2.24 09/05/2023     Historical Sleep Testing Data:   Watch pat study 10/16/23  Diagnosis: Obstructive Sleep Apnea - Mild - AHI 8.2 (3%). REM AHI 27.5. O2 nadir - 85%. Time below 88% - 0.4 minutes. Total sleep time - 8:26.    PSG 01/30/24  Sleep latency 7 minutes  CPAP at 5-15 cwp used during study  Sleep efficiency 96%  Arousal index 4.1  N3 21%  AHI 2.6  PLMI 0  Mean HR 55    MSLT  MSL 9.9 minutes  No SOREMS  UDS clear    Positive Airway Pressure (PAP) Compliance  Report & Summary Trends  DME: [employer] [facility]  11/10/23  Date >4 hr use % Time  (Total Time%) AHI PAP Rx Pressure @ 95% Median Use Time Leak-Median  (95%) Notes   11/10/23-01/07/24  95% 0.5 5-15, EPR 1 7.1 7:57 0.3    01/08/24-02/19/24 79% 0.5 5-15, EPR 1 7.3 8:13 0.0                           This note was dictated utilizing voice recognition software which may lead to typographical errors.  I apologize in advance if the situation occurs.  If questions arise please do not hesitate to contact me or call our department.    The patient (or guardian, if applicable) and other individuals in attendance with the patient were  advised that Artificial Intelligence will be utilized during this visit to record, process the conversation to generate a clinical note, and support improvement of the AI technology. The patient (or guardian, if applicable) and other individuals in attendance at the appointment consented to the use of AI, including the recording.        Electronically signed by Nat Legions, DO on10/06/2023 at 3:44 PM

## 2024-02-21 NOTE — Patient Instructions (Signed)
 Please make a follow up appointment to discuss the results of your sleep study or I will send you a "my chart" message with your results and treatment options.     The [facility] is located in the [facility], adjacent to [facility]. The lab is on the second floor. The direct number to call for sleep study related questions is: [phone].    Please call our clinic back at [phone] or send a message on MyChart if you have any questions or concerns or if you are experiencing any of the following:     You have not received a follow up appointment within 30 days prior the recommended follow up time.    If you are not tolerating treatment plan and/or not able to obtain equipment or prescribed medication(s).  if you are experiencing any difficulties with the Durable Medical Equipment  (DME) Company you may be using or is assigned to you.  Two weeks have passed and you have not received an appointment for a scheduled procedure.  Two weeks have passed since you underwent a test and/or procedure and you have not received your results.  Your  Durable Medical Equipment (DME ) company is supposed to provide you with replacement filters, tubing and masks. You can either call your DME when you need new supplies or you can arrange for an automatic shipment schedule.    Your need to be seen by our office at lat minimum of every 12 months in order to renew the prescription for these supplies.   Please make note of who your DME company is and their phone number.   Please make sure that you clean your mask and hosing on a regular basis.  Your DME can provide you with additional information regarding proper care and cleaning of your device    Driver Safety is strongly encouraged.  You should not drive if sleepy, tired, distracted and/or fatigued.      Chest Xrays and blood work do not require appointments.  They are considered "Walk-In" services and can be obtained at either [facility] or [facility].  Blood work is performed at the Laboratory (Lab) from 08:00am - 04:00 pm (if applicable to your visit)

## 2024-02-21 NOTE — Assessment & Plan Note (Addendum)
 Chronic, at goal (stable), continue current treatment plan until further etiology for excessive daytime somnolence is determined.    Mild in nature, certainly not likely to be the etiology of her degree of excessive daytime sleepiness

## 2024-02-26 ENCOUNTER — Telehealth: Admit: 2024-02-25 | Discharge: 2024-02-26 | Payer: BLUE CROSS/BLUE SHIELD | Attending: Family | Primary: Family

## 2024-02-26 DIAGNOSIS — R5382 Chronic fatigue, unspecified: Principal | ICD-10-CM

## 2024-02-26 NOTE — Progress Notes (Signed)
 "    [facility], was evaluated through a synchronous (real-time) audio-video encounter. The patient (or guardian if applicable) is aware that this is a billable service, which includes applicable co-pays. This Virtual Visit was conducted with patient's (and/or legal guardian's) consent. Patient identification was verified, and a caregiver was present when appropriate.   The patient was located at Home: [address]  [address]  [place] [address]  Provider was located at [employer] (Appt Dept): [address],  [place] [address]  Confirm you are appropriately licensed, registered, or certified to deliver care in the state where the patient is located as indicated above. If you are not or unsure, please re-schedule the visit: Yes, I confirm.     Melanie Harvey (DOB:  09/22/2000) is a Established patient, presenting virtually for evaluation of the following:      Below is the assessment and plan developed based on review of pertinent history, physical exam, labs, studies, and medications.     Assessment & Plan  Chronic fatigue       Orders:    Cortisol AM, Total; Future    HIV 1/2 Ag/Ab, 4TH Generation,W Rflx Confirm; Future    Mono w/Reflex to EBV; Future    Hepatitis Panel, Acute; Future      Assessment & Plan  1. Fatigue:  - Persistent fatigue is reported despite using a CPAP machine and having no sleep disorders confirmed by an overnight study and MSLT. The fatigue has been worsening over the past year.  - Modafinil  100 mg was ineffective. Sunosi  has been prescribed by her sleep specialist, and she will pick it up tomorrow.  - Advised to switch Celexa  intake to nighttime to see if it alleviates some of the fatigue. Further research and potential lab tests will be conducted to explore other causes of her fatigue.    2. Medication management:  - No issues with depression reported while taking Celexa . Advised to continue her current birth control regimen. She does not have a gynecologist at  present, and this will be kept in mind for future reference.    3. Birth control management:  - Recently switched birth control due to irregular periods. She does not have a gynecologist at present, and this will be kept in mind for future reference.  Chronic fatigue  -     Cortisol AM, Total; Future  -     HIV 1/2 Ag/Ab, 4TH Generation,W Rflx Confirm; Future  -     Mono w/Reflex to EBV; Future  -     Hepatitis Panel, Acute; Future    Results  Diagnostic Testing   - Overnight sleep study and MSLT: No sleep disorders      Return in about 2 weeks (around 03/11/2024) for LABREVIEW, VV.       Subjective   HPI  History of Present Illness  The patient presents for evaluation of fatigue and sleepiness.    She has been experiencing excessive sleepiness since high school, which has progressively worsened over the past year. Despite undergoing an overnight study and MSLT with a sleep specialist, no sleep disorders were identified. She was diagnosed with sleep apnea and uses a CPAP machine, which did not require any adjustments. Her fatigue is significant, often sleeping from 5:00 PM to 8:00 or 9:00 AM the next day and still feeling the need for a nap during the day. 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 She has not tried any weight loss medications. She was prescribed modafinil  100 mg, but it did not alleviate her symptoms. A new medication, Sunosi , was prescribed last week, but she has not yet started it.    She switched her birth control at her annual visit due to irregular periods and does not currently have a gynecologist.    She has been on Celexa  since 2021, taken in the morning, and reports no issues with depression.    Sleep: Reports significant fatigue and sleepiness, often sleeping from 5:00 PM to 8:00 or 9:00 AM the next day and still feeling the need for a nap during the day.      Review of Systems   Constitutional:  Positive for fatigue.           Objective   Patient-Reported Vitals  No data recorded     Physical Exam  Constitutional:       General: She is not in acute distress.     Appearance: Normal appearance.   HENT:      Head: Normocephalic and atraumatic.   Pulmonary:      Effort: Pulmonary effort is normal.   Neurological:      General: No focal deficit present.      Mental Status: She is alert and oriented to person, place, and time.   Psychiatric:         Mood and Affect: Mood normal.         Behavior: Behavior normal.         Thought Content: Thought content normal.         Judgment: Judgment normal.              On this date 02/26/2024 I have spent 29 minutes reviewing previous notes, test results, and other pertinent medical information with the patient, discussing the diagnosis and importance of compliance with the treatment plan as well as documenting on the day of the visit.    --[name], APRN - NP         "

## 2024-02-27 ENCOUNTER — Encounter: Admit: 2024-02-27 | Payer: BLUE CROSS/BLUE SHIELD | Primary: Family

## 2024-02-27 ENCOUNTER — Inpatient Hospital Stay: Admit: 2024-02-27 | Payer: BLUE CROSS/BLUE SHIELD | Primary: Family

## 2024-02-27 DIAGNOSIS — R5382 Chronic fatigue, unspecified: Principal | ICD-10-CM

## 2024-02-27 LAB — HIV 1/2 AG/AB, 4TH GENERATION,W RFLX CONFIRM: HIV 1/2 Interp: NONREACTIVE

## 2024-02-27 NOTE — Addendum Note (Signed)
"  Addended by: GEORGINA IP A on: 02/27/2024 08:10 AM     Modules accepted: Orders    "

## 2024-02-28 LAB — CORTISOL AM, TOTAL: Cortisol - AM: 16 ug/dL (ref 4.8–19.5)

## 2024-03-01 LAB — EPSTEIN BARR VIRUS (EBV) ANTIBODY PANEL I
EBV Nuclear Ag Ab: <18 U/mL (ref 0.0–17.9)
EBV VCA,IgG: >600 U/mL — ABNORMAL HIGH (ref 0.0–17.9)
EBV VCA,IgM: <36 U/mL (ref 0.0–35.9)

## 2024-03-01 LAB — MONO W/REFLEX TO EBV: MONO W/ REFLEX EBV: NEGATIVE

## 2024-03-06 ENCOUNTER — Encounter

## 2024-03-27 ENCOUNTER — Encounter

## 2024-05-06 ENCOUNTER — Encounter

## 2024-05-06 MED ORDER — SOLRIAMFETOL HCL 150 MG PO TABS
150 | ORAL_TABLET | Freq: Every day | ORAL | 1 refills | Status: AC
Start: 2024-05-06 — End: ?

## 2024-05-06 NOTE — Telephone Encounter (Signed)
Refilling

## 2024-05-20 ENCOUNTER — Ambulatory Visit: Admit: 2024-05-20 | Discharge: 2024-05-20 | Payer: BLUE CROSS/BLUE SHIELD | Attending: Family | Primary: Family

## 2024-05-20 VITALS — BP 144/99 | HR 95 | Temp 98.20000°F | Resp 16 | Ht 69.0 in | Wt 318.0 lb

## 2024-05-20 DIAGNOSIS — I10 Essential (primary) hypertension: Secondary | ICD-10-CM

## 2024-05-20 DIAGNOSIS — Z09 Encounter for follow-up examination after completed treatment for conditions other than malignant neoplasm: Principal | ICD-10-CM

## 2024-05-20 MED ORDER — PROPRANOLOL HCL ER 60 MG PO CP24
60 | ORAL_CAPSULE | Freq: Every day | ORAL | 1 refills | 60.00000 days | Status: DC
Start: 2024-05-20 — End: 2024-06-10

## 2024-05-20 NOTE — Progress Notes (Signed)
 "Melanie Harvey (DOB:  [birth year]) is a 23 y.o. female, here for evaluation of the following medical concerns:  Chief Complaint   Patient presents with    Follow-up     Chest pain and high BP         ASSESSMENT/PLAN:    Assessment & Plan  1. Elevated blood pressure:  - High blood pressure readings were reported at home and during a recent ER visit. Chest pain and elevated heart rate prompted the ER visit.  - No stress or anxiety triggers were identified. Recent life changes include time off work and support from in-laws, which she finds non-stressful.  - I am concerned if the BB creates more daytime fatigue.       Primary hypertension  -     propranolol  (INDERAL  LA) 60 MG extended release capsule; Take 1 capsule by mouth daily, Disp-90 capsule, R-1Normal  Hospital discharge follow-up  -     propranolol  (INDERAL  LA) 60 MG extended release capsule; Take 1 capsule by mouth daily, Disp-90 capsule, R-1Normal  Tachycardia  -     propranolol  (INDERAL  LA) 60 MG extended release capsule; Take 1 capsule by mouth daily, Disp-90 capsule, R-1Normal  Chest pain, unspecified type  -     propranolol  (INDERAL  LA) 60 MG extended release capsule; Take 1 capsule by mouth daily, Disp-90 capsule, R-1Normal    Results        History of Present Illness  The patient presents for evaluation of elevated blood pressure with hospital follow up for cp and hypertension. I do not have access to her [facility] discharge information today, she reports heart workup was normal    She experienced an episode of elevated heart rate, as indicated by her smartwatch, on Friday. This was followed by the onset of chest pain on Saturday, 05/17/2024, prompting a visit to the emergency room at [facility]. During her ER visit, she was informed that her blood pressure was elevated. Despite spending 8 hours in the ER, she did not receive a definitive diagnosis or explanation for her symptoms. She reports no recent stressors or anxiety triggers and notes that her  life has been relatively calm, with a few days off work and supportive in-laws.  She presented to the ER for chest pain and elevated bp on 05/17/2024    History of morbid obesity, mild osa placed on Solriamfetol ,failed cpap and modafinil  for excessive daytime sleepiness- this is not a sleep issue per sleep medicine  - neg for narcolepsy, hypersomnia    Review of Systems   Constitutional:  Negative for diaphoresis and fatigue.   Respiratory:  Negative for cough, chest tightness and shortness of breath.    Cardiovascular:  Positive for chest pain. Negative for palpitations and leg swelling.   Gastrointestinal:  Negative for abdominal pain, nausea and vomiting.   Neurological:  Negative for dizziness, weakness and headaches.       Prior to Visit Medications   Medication Sig Taking? Authorizing Provider   Tapinarof (VTAMA) 1 % CREA Apply topically Yes [provider]   propranolol  (INDERAL  LA) 60 MG extended release capsule Take 1 capsule by mouth daily Yes Mack Thurmon, Clotilda HERO, APRN - NP   Solriamfetol  HCl 150 MG TABS Take 1 tablet by mouth daily Max Daily Amount: 1 tablet Yes Donah Garre, DO   citalopram  (CELEXA ) 20 MG tablet Take 1 tablet by mouth daily Yes Tashaya Ancrum, Clotilda HERO, APRN - NP   Norgestim-Eth Marton Triphasic (TRI-ESTARYLLA) 0.18/0.215/0.25 MG-35 MCG TABS TAKE 1  TABLET BY MOUTH EVERY DAY Yes Lynae Pederson, Clotilda HERO, APRN - NP   betamethasone dipropionate 0.05 % ointment Apply topically 2 times daily Yes [provider]   clobetasol (TEMOVATE) 0.05 % ointment PLEASE SEE ATTACHED FOR DETAILED DIRECTIONS  [provider]        Social History     Tobacco Use    Smoking status: Never     Passive exposure: Never    Smokeless tobacco: Never   Substance Use Topics    Alcohol use: Never        BP (!) 144/99 (BP Site: Left Upper Arm, Patient Position: Sitting, BP Cuff Size: Large Adult)   Pulse 95   Temp 98.2 F (36.8 C) (Temporal)   Resp 16   Ht 1.753 m (5' 9)   Wt (!) 144.2 kg  (318 lb)   LMP 05/08/2024 (Exact Date)   SpO2 97%   BMI 46.96 kg/m   Estimated body mass index is 46.96 kg/m as calculated from the following:    Height as of this encounter: 1.753 m (5' 9).    Weight as of this encounter: 144.2 kg (318 lb).  Past Medical History:   Diagnosis Date    Anxiety     Depression     Sleep apnea      Past Surgical History:   Procedure Laterality Date    APPENDECTOMY  2021    TONSILLECTOMY  2017     Family History   Problem Relation Age of Onset    Cancer Father         on kidney, removed in 2020.    Diabetes Father         type 2    Asthma Father 29 - 30    Arthritis Mother 98 - 87       Lab Results   Component Value Date    WBC 9.1 09/05/2023    HGB 14.6 09/05/2023    HCT 46.3 (H) 09/05/2023    MCV 92.6 09/05/2023    PLT 234 09/05/2023    LYMPHOPCT 26.5 09/05/2023    RBC 5.00 09/05/2023    MCH 29.2 09/05/2023    MCHC 31.5 09/05/2023    RDW 12.1 09/05/2023     Lab Results   Component Value Date    NA 136 09/05/2023    K 4.5 09/05/2023    CL 104 09/05/2023    CO2 27 09/05/2023    BUN 12 09/05/2023    CREATININE 0.68 09/05/2023    GLUCOSE 79 09/05/2023    CALCIUM 9.5 09/05/2023    BILITOT 0.8 09/05/2023    ALKPHOS 86 09/05/2023    AST 16 09/05/2023    ALT 28 09/05/2023    LABGLOM >90 09/05/2023    GLOB 3.6 09/05/2023     Lab Results   Component Value Date    TSH 2.24 09/05/2023     Hemoglobin A1C   Date Value Ref Range Status   09/05/2023 4.6 4.2 - 5.6 % Final     Comment:     (NOTE)  HbA1C Interpretive Ranges  <5.7              Normal  5.7 - 6.4         Consider Prediabetes  >6.5              Consider Diabetes       Lab Results   Component Value Date    CHOL 171 09/05/2023    CHOL  238 (H) 02/28/2022     Lab Results   Component Value Date    TRIG 60 09/05/2023    TRIG 151 (H) 02/28/2022     Lab Results   Component Value Date    HDL 56 09/05/2023    HDL 63 (H) 02/28/2022     No components found for: LALLA [facility]  Lab Results   Component Value Date    VLDL 12 09/05/2023     VLDL 30.2 02/28/2022     Lab Results   Component Value Date    CHOLHDLRATIO 3.1 09/05/2023    CHOLHDLRATIO 3.8 02/28/2022         Physical Exam  Constitutional:       General: She is not in acute distress.     Appearance: Normal appearance.   HENT:      Head: Normocephalic and atraumatic.   Pulmonary:      Effort: Pulmonary effort is normal.   Neurological:      General: No focal deficit present.      Mental Status: She is alert and oriented to person, place, and time.   Psychiatric:         Mood and Affect: Mood normal.         Behavior: Behavior normal.         Thought Content: Thought content normal.         Judgment: Judgment normal.         Disclaimer:    The patient understands our medical plan.  Alternatives have been explained and offered.  The risks, benefits and significant side effects of all medications have been reviewed. Anticipated time course and progression of condition reviewed. All questions have been addressed.  She is encouraged to employ the information provided in the after visit summary, which was reviewed.      Where applicable, she is instructed to call the clinic if she has not been notified either by phone or through MyChart with the results of her tests/labs or with an appointment plan for any referrals within 1 week(s). The patient is to call if her condition worsens or fails to improve or if significant side effects are experienced.     Please note that this dictation was completed with Dragon, the computer voice recognition software. Quite often unanticipated grammatical, syntax, homophones, and other interpretive errors are inadvertently transcribed by the computer software. Please disregard these errors. Please excuse any errors that have escaped final proofreading.    An electronic signature was used to authenticate this note.          --[name], APRN - NP on 05/21/2024 at 8:12 AM            "

## 2024-05-20 NOTE — Progress Notes (Signed)
"  Have you been to the ER, urgent care clinic since your last visit?  Hospitalized since your last visit?   YES - When: approximately 4 days ago.  Where and Why: [facility] for chest pain and elevated BP.    Have you seen or consulted any other health care providers outside our system since your last visit?   NO        "

## 2024-06-05 NOTE — Telephone Encounter (Signed)
"  This request/message was routed to the provider for review and is in que to be addressed, thank you.  "

## 2024-06-10 ENCOUNTER — Ambulatory Visit: Admit: 2024-06-10 | Discharge: 2024-06-10 | Payer: BLUE CROSS/BLUE SHIELD | Attending: Family | Primary: Family

## 2024-06-10 VITALS — BP 137/82 | HR 81 | Temp 98.30000°F | Resp 20 | Ht 69.0 in | Wt 326.4 lb

## 2024-06-10 DIAGNOSIS — R000 Tachycardia, unspecified: Principal | ICD-10-CM

## 2024-06-10 MED ORDER — PROPRANOLOL HCL ER 80 MG PO CP24
80 | ORAL_CAPSULE | Freq: Every day | ORAL | 0 refills | 60.00000 days | Status: AC
Start: 2024-06-10 — End: ?

## 2024-06-10 NOTE — Progress Notes (Signed)
 Melanie Harvey (DOB:  2000/07/06) is a 24 y.o. female, here for evaluation of the following medical concerns:  Chief Complaint   Patient presents with    Hypertension     3 week follow-up     Tachycardia    Chest Pain         ASSESSMENT/PLAN:    Assessment & Plan    Tachycardia  -     propranolol  (INDERAL  LA) 80 MG extended release capsule; Take 1 capsule by mouth daily, Disp-90 capsule, R-0Normal  Primary hypertension  -     propranolol  (INDERAL  LA) 80 MG extended release capsule; Take 1 capsule by mouth daily, Disp-90 capsule, R-0Normal  Chronic pain of both feet  -     [facility] Referral  Class 3 severe obesity due to excess calories without serious comorbidity with body mass index (BMI) of 40.0 to 44.9 in adult [facility])  -     Amb External Referral To Endocrinology  Excessive daytime sleepiness  -     Amb External Referral To Endocrinology  Chronic fatigue  -     Amb External Referral To Endocrinology    Results       History of Present Illness    3 week f/u for hypertension placed on BB, was concerned if this would create additional fatigue      History of morbid obesity, mild osa placed on Solriamfetol ,failed cpap and modafinil  for excessive daytime sleepiness- this is not a sleep issue per sleep medicine  - neg for narcolepsy, hypersomnia    Review of Systems   Constitutional:  Negative for diaphoresis and fatigue.   Respiratory:  Negative for cough, chest tightness and shortness of breath.    Cardiovascular:  Negative for chest pain, palpitations and leg swelling.   Gastrointestinal:  Negative for abdominal pain, nausea and vomiting.   Neurological:  Negative for dizziness, weakness and headaches.       Prior to Visit Medications   Medication Sig Taking? Authorizing Provider   Prenatal Vit-Fe Fumarate-FA (PRENATAL PO) Take by mouth daily Yes [provider]   propranolol  (INDERAL  LA) 80 MG extended release capsule Take 1 capsule by mouth daily Yes Vaniya Augspurger, Clotilda HERO, APRN -  NP   Tapinarof (VTAMA) 1 % CREA Apply topically Yes [provider]   clobetasol (TEMOVATE) 0.05 % ointment PLEASE SEE ATTACHED FOR DETAILED DIRECTIONS Yes [provider]   citalopram  (CELEXA ) 20 MG tablet Take 1 tablet by mouth daily Yes Nam Vossler, Clotilda HERO, APRN - NP   Solriamfetol  HCl 150 MG TABS Take 1 tablet by mouth daily Max Daily Amount: 1 tablet  Patient not taking: Reported on 06/10/2024  Donah Garre, DO   Norgestim-Eth Estrad Triphasic (TRI-ESTARYLLA) 0.18/0.215/0.25 MG-35 MCG TABS TAKE 1 TABLET BY MOUTH EVERY DAY  Patient not taking: Reported on 06/10/2024  Arnett Clotilda HERO, APRN - NP   betamethasone dipropionate 0.05 % ointment Apply topically 2 times daily  Patient not taking: Reported on 06/10/2024  [provider]        Social History     Tobacco Use    Smoking status: Never     Passive exposure: Never    Smokeless tobacco: Never   Substance Use Topics    Alcohol use: Never        BP 137/82 (BP Site: Left Upper Arm, Patient Position: Sitting, BP Cuff Size: Large Adult)   Pulse 81   Temp 98.3 F (36.8 C) (Temporal)   Resp 20  Ht 1.753 m (5' 9)   Wt (!) 148.1 kg (326 lb 6.4 oz) Comment: with shoes  LMP 06/01/2024 (Exact Date)   SpO2 98%   BMI 48.20 kg/m   Estimated body mass index is 48.2 kg/m as calculated from the following:    Height as of this encounter: 1.753 m (5' 9).    Weight as of this encounter: 148.1 kg (326 lb 6.4 oz).  Past Medical History:   Diagnosis Date    Anxiety     Depression     Sleep apnea      Past Surgical History:   Procedure Laterality Date    APPENDECTOMY  2021    TONSILLECTOMY  2017     Family History   Problem Relation Age of Onset    Cancer Father         on kidney, removed in 2020.    Diabetes Father         type 2    Asthma Father 37 - 53    Arthritis Mother 34 - 27       Lab Results   Component Value Date    WBC 9.1 09/05/2023    HGB 14.6 09/05/2023    HCT 46.3 (H) 09/05/2023    MCV 92.6 09/05/2023    PLT 234 09/05/2023     LYMPHOPCT 26.5 09/05/2023    RBC 5.00 09/05/2023    MCH 29.2 09/05/2023    MCHC 31.5 09/05/2023    RDW 12.1 09/05/2023     Lab Results   Component Value Date    NA 136 09/05/2023    K 4.5 09/05/2023    CL 104 09/05/2023    CO2 27 09/05/2023    BUN 12 09/05/2023    CREATININE 0.68 09/05/2023    GLUCOSE 79 09/05/2023    CALCIUM 9.5 09/05/2023    BILITOT 0.8 09/05/2023    ALKPHOS 86 09/05/2023    AST 16 09/05/2023    ALT 28 09/05/2023    LABGLOM >90 09/05/2023    GLOB 3.6 09/05/2023     Lab Results   Component Value Date    TSH 2.24 09/05/2023     Hemoglobin A1C   Date Value Ref Range Status   09/05/2023 4.6 4.2 - 5.6 % Final     Comment:     (NOTE)  HbA1C Interpretive Ranges  <5.7              Normal  5.7 - 6.4         Consider Prediabetes  >6.5              Consider Diabetes       Lab Results   Component Value Date    CHOL 171 09/05/2023    CHOL 238 (H) 02/28/2022     Lab Results   Component Value Date    TRIG 60 09/05/2023    TRIG 151 (H) 02/28/2022     Lab Results   Component Value Date    HDL 56 09/05/2023    HDL 63 (H) 02/28/2022     No components found for: LALLA [facility]  Lab Results   Component Value Date    VLDL 12 09/05/2023    VLDL 69.7 02/28/2022     Lab Results   Component Value Date    CHOLHDLRATIO 3.1 09/05/2023    CHOLHDLRATIO 3.8 02/28/2022         Physical Exam  Constitutional:       General: She is  not in acute distress.     Appearance: Normal appearance.   HENT:      Head: Normocephalic and atraumatic.   Pulmonary:      Effort: Pulmonary effort is normal.   Neurological:      General: No focal deficit present.      Mental Status: She is alert and oriented to person, place, and time.   Psychiatric:         Mood and Affect: Mood normal.         Behavior: Behavior normal.         Thought Content: Thought content normal.         Judgment: Judgment normal.         Disclaimer:    The patient understands our medical plan.  Alternatives have been explained and offered.  The risks, benefits and  significant side effects of all medications have been reviewed. Anticipated time course and progression of condition reviewed. All questions have been addressed.  She is encouraged to employ the information provided in the after visit summary, which was reviewed.      Where applicable, she is instructed to call the clinic if she has not been notified either by phone or through MyChart with the results of her tests/labs or with an appointment plan for any referrals within 1 week(s). The patient is to call if her condition worsens or fails to improve or if significant side effects are experienced.     Please note that this dictation was completed with Dragon, the computer voice recognition software. Quite often unanticipated grammatical, syntax, homophones, and other interpretive errors are inadvertently transcribed by the computer software. Please disregard these errors. Please excuse any errors that have escaped final proofreading.    An electronic signature was used to authenticate this note.          --[name], APRN - NP on 06/10/2024 at 4:10 PM

## 2024-06-10 NOTE — Progress Notes (Signed)
 Have you been to the ER, urgent care clinic since your last visit?  Hospitalized since your last visit?   NO    Have you seen or consulted any other health care providers outside our system since your last visit?   Yes, 06/03/2024- Dermatology- Dr. Alm Christine    Have you had a diabetic eye exam?    YES - Where: 03/2024- Optometry- [facility]) Nurse/CMA to request most recent records if not in the chart     No diabetic eye exam on file

## 2024-06-25 ENCOUNTER — Ambulatory Visit: Admit: 2024-06-25 | Discharge: 2024-06-25 | Payer: BLUE CROSS/BLUE SHIELD | Attending: Otolaryngology | Primary: Family

## 2024-06-25 DIAGNOSIS — G4733 Obstructive sleep apnea (adult) (pediatric): Principal | ICD-10-CM

## 2024-06-25 NOTE — Progress Notes (Signed)
 [facility]   Sleep Medicine     Office Progress Note        [address]  [place] [address]  [phone]  [phone] Fax    Reason for visit/referral:  f/u Obstructive Sleep Apnea with CPAP  Assessment:      1. OSA (obstructive sleep apnea)  Overview:  Watch pat study 10/16/23  Diagnosis: Obstructive Sleep Apnea - Mild - AHI 8.2 (3%). REM AHI 27.5. O2 nadir - 85%. Time below 88% - 0.4 minutes. Total sleep time - 8:26.  2. Excessive daytime sleepiness  3. Depression, unspecified depression type  4. Morbid obesity with BMI of 45.0-49.9, adult [facility])  Assessment & Plan:   Has been referred to endocrinology by her PCP    Last seen in office on 02/21/24, prior 01/09/24, 09/19/23.    Since I last saw her, she stopped using her CPAP machine.  It appears that she last used it in November 2025.    Assessment & Plan  1.  Obstruct sleep Apnea.  Her home study indicates mild sleep apnea, which does not fully explain her excessive sleepiness. The use of CPAP did not result in significant improvement, suggesting that her sleepiness may be secondary to another condition.    She thinks she may have a weight gain of 20 pounds since her last visit.     A repeat home study will be considered if she decides to pursue treatment for obstructive sleep apnea if she had at least moderate OSA on a repeat study.    We also discussed other options for treatment such as an oral appliance but since she did not get any benefit from CPAP she does not want to go that route at this point.    At some point we may need to repeat a PSG/MSLT since her excessive daytime sleepiness has been ongoing since youth.    I think she should first get her blood pressure under control and she is seeing an endocrinologist in April.    She actually stated today that obstructive sleep apnea is the least of her worries.    2. Elevated Blood Pressure.  Her blood pressure was recorded at 158/90 during the visit. She is  currently on propranolol  80 mg but reported forgetting to take it this morning. She mentioned that her OB/GYN recommended switching to Procardia or labetalol. She will follow up with her OB/GYN next week to discuss changing her medication.    3. Tachycardia.  She reported a consistent heart rate in the 120s and felt unwell during the visit. She mentioned that her heart rate was high during her ER visit in December, where she underwent an EKG and a CT scan of her chest, both of which were unremarkable. She will follow up with an endocrinologist on April 8 to investigate potential hormonal or metabolic causes.    4. Depression and anxiety.  Her depression and anxiety are well-managed with her current treatment.        Prior discussion;  Since the last visit, the patient had a PSG/MSLT.  Her PSG did not demonstrate any pathology at all.  Her sleep latency was 7 minutes and her sleep efficiency was high at 96%.    Her mean sleep latency on her MSLT was 9.9 minutes, not indicative of idiopathic hypersomnia or narcolepsy.    She also stated today that the Modafinil  does not help her very much at all.    She  continues to be very compliant with CPAP, using her machine between 8 and 9 hours per night on average.  Her residual AHI is low at 0.5.    She did do a sleep diary for about a week and a half.  It appears that she is sleeping around 7 hours per night and then on the night before a day off she will sleep for 11 hours, suggesting she needs far more sleep than she is getting on every other night of the week.  Even when she sleeps 11 hours she naps 2 hours during the day the next day which is unusual.    My impression now is that this is not a primary sleep disorder and at this point, she should return to her PCP for more thorough evaluation for things such as anemia, hypothyroidism, ongoing/worsening depression or even something such as an endocrine disorder (hypercortisolism) excetra.    For now, we may first want to do  2 things: Try extending her sleep hours during the workweek is much as possible, if able, and possibly trying Sunosi , 150 mg (instead of Modafinil ) for a few weeks to see if that combination helps.    She needs to f/u with her PCP at this point.    Prior Assessment & Plan  1. Sleep Apnea.  Her sleep study results do not indicate narcolepsy or idiopathic hypersomnia, but rather mild sleep apnea. The current treatment with modafinil  has proven ineffective. A prescription for Sunosi  150 mg will be provided, with instructions to halve the dose and take it once daily for a few days to monitor for potential side effects such as headaches.    2. Depression.  She reports that her depression is well-managed with Celexa  and does not believe it is contributing to her sleep issues. She is currently on a low dose of Celexa  and does not report any suicidal ideation or daily crying.      Prior discussion:  Patient has a history of mild obstructive sleep apnea diagnosed in May 2025.    She got her current CPAP machine November 10, 2023.  Compliance more than 4 hours usage is 95%.  Residual AHI is low at 0.5 and she is using machine about 8 hours per night on average.    Today she stated that she really does not like her CPAP machine but has been using it faithfully.  It is made 0 difference in her sleep quality or daytime sleepiness and her excessive daytime sleepiness actually gotten worse.  She actually became tearful during the visit because she was hopeful that using CPAP would benefit her but it has not    In reference to her sleep complaints, previously she was snoring and was needing to nap for 2 to 3 hours a day.  With her current degree of excessive daytime somnolence, which is actually worse than it was when I first saw her, I think we need to move forward with a PSG/MSLT as she certainly could also have narcolepsy or idiopathic hypersomnia.    Prior discussion:  Melanie Harvey is a 24 year old female with a history of  depression, increased BMI and who complains of snoring, and excessive daytime sleepiness it has been going on for years.    She sleeps the entire night and only maybe wakes up once briefly during the night and has to drag herself out of bed and it takes her an hour to be able to function.    She naps for sometimes 2  to 3 hours a day.    We discussed obstructive sleep apnea, idiopathic hypersomnia and narcolepsy today.    The first step is to evaluate for sleep disordered breathing and, in particular, due to the fact that her BMI is 44.  We discussed weight loss as well today and strategies to avoid falling asleep driving.    I have discussed the nature and definition of obstructive sleep apnea and why it would be important to evaluate for this.  We did discuss how undiagnosed/untreated obstructive sleep apnea can affect other medical conditions/disorders, and in particular, cardiovascular disorders.  The consequences of untreated obstructive sleep apnea include the increased risk of stroke, heart disease, hypertension, cognitive difficulties (attention, memory), possible endocrine difficulties to include insulin resistance and possible increased risk of diabetes, increased sleepiness and fatigue and increased risk of motor vehicle accidents.    We discussed the different kinds of sleep studies and I believe a home sleep apnea test is a reasonable option in the setting.    We did discuss not driving sleepy and how weight gain/weight loss can affect obstructive sleep apnea.    Patient asked appropriate questions and would like to move forward with obtaining desired study.      Plan:     As above    Prior plan:  I will place an Rx/order for CPAP supplies and we will continue the current settings on her machine as these appear to be appropriate, at least for now.  I think in the end we will be discontinuing her CPAP after we get through her PSG/MSLT.    I will go ahead and order a PSG/MSLT.  She will need to bring her  CPAP machine and use it during at least the PSG portion of the study.  The reason for this is mainly because when she goes into REM her sleep apnea becomes moderate and close to severe with 28 events per hour.  I just do not want this to interfere with the results at all.     I gave her a 2-week sleep log and have asked her to complete at least a week of it ahead of her PSG/MSLT.    We discussed her Celexa  and I do not think discontinuing it would be reasonable as I do not want to precipitate worsening depression which can affect the outcome of the study as well.    I think a trial of modafinil  would be helpful at this juncture but she will have to discontinue it a week before her PSG/MSLT.  Her level of excessive daytime sleepiness actually has me worried she may fall asleep driving and I am not sure how many weeks it will take to get her PSG/MSLT scheduled.  I will give her the lower dose of modafinil , 100 mg, and I did discuss possible side effects.    After the patient left I also noticed that she was taking OCPs still have to message her that there is a theoretical possible interaction between the 2 which could lower effectiveness of oral contraceptives.    I will have her follow-up after the PSG/MSLT and we will go over the results and come up with a regimen for her to start treating either her narcolepsy or idiopathic hypersomnia.    Prior plan:  I will order a home sleep apnea test and message her with results.  We did discuss treatment options depending on degree and nature of obstructive sleep apnea.    This information was analyzed  to assess complexity and medical decision making in regards to further testing and management.    Continue meds for: Depression. Pt would medically benefit from wt loss for OSA (diet, exercise, surgical).    No orders of the defined types were placed in this encounter.         Treatment options including CPAP, dental appliance, weight reduction measures, positional therapy,  surgeries etc were at least briefly discussed.  Healthy lifestyle changes to include weight loss and exercise discussed.  Healthy sleep habits were reviewed and encouraged.  Driver and workplace safety reviewed and discussed as appropriate. Drowsy and/or inattentive driving should be avoided.  Follow up with Primary Care Provider (PCP) as directed and for routine health care maintenance.  Follow-up: 1-2 months (after sleep study complete), sooner should new symptoms or problems arise.  Thank you for allowing me to participate in the care of your patient!  Subjective:     Patient ID: Melanie Harvey is a 24 y.o. female.    Chief Complaint   Patient presents with    Follow-up     CPAP & Excessive daytime sleepiness.     HPI:      Melanie Harvey is a 24 y.o. female referred by No ref. provider found for a sleep evaluation. She complains of: I am sleepy all the time.    The patient goes to bed at 8:30 PM and it takes her 15 minutes to fall asleep.  She might wake up once during the night and has no trouble getting back to sleep.  On her workdays, the alarm goes off at 4:15 and it takes her 15 minutes to get out of bed.  It takes her an hour to wake up and she feels groggy.    On the weekends she will sleep until 8 or 10 AM but does not feel any better or less tired/sleepy.    She does snore but is not sure if she has any witnessed apneas.  She denies waking up choking or gasping for air.    She naps in the afternoon for sometimes 3 to 4 hours, gets up eats dinner and goes back to sleep for the night.    Apparently this has been going on for several years and she was sleepy as a ship broker.  She would fall asleep in the car without question and fell asleep in class all the time.    She denies sleep paralysis and hypnagogic hallucinations.    Previous sleep evaluation and treatment has included- None    Previous Report(s) Reviewed: historical medical records, office notes, and referral letter(s). Pertinent data  has been documented.    Sleep symptoms:  Yes No  Additional Comments   Snoring [x]  []     Witness Apneas []  [x]     Waking snorting/gasping []  [x]     Nocturia []     [x]     Legs kicking at night []  [x]     AM Headaches []  [x]     Non-restorative sleep [x]  []     Daytime sleepiness/fatigue [x]  []     Daytime napping [x]  []     Drowsiness while driving [x]  []  No MVAs   Memory issues [x]  []     Decreased concentration [x]  []     Cataplexy []  [x]     Hypnogogic hallucinations []  [x]     Sleep paralysis []  [x]     Bruxism []  [x]     Clock Watching []  [x]     Other []  []       Additional HPI 10/225:  History of Present Illness  The patient is a 24 year old female who presents for follow-up of her PSG/MSLT study.    She reports no improvement in her sleep quality with the use of CPAP. Modafinil  was initially mildly effective, but its benefits have diminished over time.     She estimates that she requires approximately 10 hours of sleep daily, which she finds challenging to achieve due to her work schedule. Despite adequate sleep, she often feels the need to nap during the day.     She does not typically fall asleep during conversations but admits to dozing off while reading or watching TV.     She also mentions a recent incident where she fell asleep while driving to [place] , a 4-hour drive, and is unsure if this was due to fatigue or another cause.     She has been experiencing excessive sleepiness since middle school, which has persisted into her high school years.     She recalls being overweight as a child and weighing around 170 pounds in high school when she was active in sports.     She has irregular menstrual cycles, which are managed with birth control pills.Without birth control, her periods last for 7 days and are accompanied by cramps. She does not have a history of anemia.    She was diagnosed with depression in 2021 and has been managing it with Celexa . She reports that her depression is well-controlled and does not  believe it is contributing to her sleep issues.    FAMILY HISTORY  Her father had kidney cancer removed a couple of years ago. Her father also has the same sleep problem but works night shifts and switches between day and night sleep. There is no family history of endocrinology problems that she is aware of.    MEDICATIONS  Current: modafinil , Celexa       The patient is a 24 year old female here for follow-up for her sleep apnea.          06/19/2024     6:49 PM 02/20/2024     7:51 PM 01/30/2024     9:00 PM 01/06/2024    10:51 AM 09/19/2023     8:18 AM   Sleep Questionnaire   Sitting and reading 3 3 3 3 3    Watching TV 2 2 3 3 2    Sitting, inactive in a public place (e.g. a theatre or a meeting) 2 3 3 3 3    As a passenger in a car for an hour without a break 3 2 3 2 2    Lying down to rest in the afternoon when circumstances permit 3 3 3 3 3    Sitting and talking to someone 1 1 2 1 1    Sitting quietly after a lunch without alcohol 2 2 3 2 3    In a car, while stopped for a few minutes in traffic 2 2 2 2 1    Epworth Sleepiness Score 18  18  22 19  18         Patient-reported       Occupation/work hours: Works at [employer], 5 days out of 7 from 5:15 AM to 2 PM    Caffeine Intake -soda, maybe 1/day    Social History     Socioeconomic History    Marital status: Married     Spouse name: Not on file    Number of children: Not on file    Years of education: Not on file    Highest  education level: Not on file   Occupational History    Not on file   Tobacco Use    Smoking status: Never     Passive exposure: Never    Smokeless tobacco: Never   Vaping Use    Vaping status: Never Used   Substance and Sexual Activity    Alcohol use: Never    Drug use: Never    Sexual activity: Never     Partners: Male     Comment: Husband.   Other Topics Concern    Not on file   Social History Narrative    Not on file     Social Drivers of Health     Financial Resource Strain: Medium Risk (02/28/2022)    Overall Financial Resource Strain (CARDIA)      Difficulty of Paying Living Expenses: Somewhat hard   Food Insecurity: No Food Insecurity (06/10/2024)    Hunger Vital Sign     Worried About Running Out of Food in the Last Year: Never true     Ran Out of Food in the Last Year: Never true   Transportation Needs: No Transportation Needs (06/10/2024)    PRAPARE - Therapist, Art (Medical): No     Lack of Transportation (Non-Medical): No   Physical Activity: Insufficiently Active (02/28/2022)    Exercise Vital Sign     Days of Exercise per Week: 2 days     Minutes of Exercise per Session: 60 min   Stress: Not on file   Social Connections: Not on file   Intimate Partner Violence: Not At Risk (02/28/2022)    Humiliation, Afraid, Rape, and Kick questionnaire     Fear of Current or Ex-Partner: No     Emotionally Abused: No     Physically Abused: No     Sexually Abused: No   Housing Stability: Low Risk  (06/10/2024)    Housing Stability Vital Sign     Unable to Pay for Housing in the Last Year: No     Number of Times Moved in the Last Year: 0     Homeless in the Last Year: No        Current Outpatient Medications   Medication Instructions    betamethasone dipropionate 0.05 % ointment 2 TIMES DAILY    citalopram  (CELEXA ) 20 mg, Oral, DAILY    clobetasol (TEMOVATE) 0.05 % ointment PLEASE SEE ATTACHED FOR DETAILED DIRECTIONS    Norgestim-Eth Estrad Triphasic (TRI-ESTARYLLA) 0.18/0.215/0.25 MG-35 MCG TABS TAKE 1 TABLET BY MOUTH EVERY DAY    Prenatal Vit-Fe Fumarate-FA (PRENATAL PO) DAILY    propranolol  (INDERAL  LA) 80 mg, Oral, DAILY    Solriamfetol  HCl 150 MG TABS 1 tablet, Oral, DAILY    Tapinarof (VTAMA) 1 % CREA Apply topically       Allergies as of 06/25/2024    (No Known Allergies)       Patient Active Problem List   Diagnosis    Class 3 severe obesity due to excess calories without serious comorbidity with body mass index (BMI) of 40.0 to 44.9 in adult [facility])    Depression    Excessive daytime sleepiness    OSA (obstructive sleep apnea)    Morbid  obesity with BMI of 45.0-49.9, adult [facility])       Past Medical History:   Diagnosis Date    Anxiety     Depression     Sleep apnea        Past Surgical History:  Procedure Laterality Date    APPENDECTOMY  2021    TONSILLECTOMY  2017       Family History   Problem Relation Age of Onset    Cancer Father         on kidney, removed in 2020.    Diabetes Father         type 2    Asthma Father 66 - 41    Arthritis Mother 3 - 86         Objective:     Vitals:    Weight BMI   Wt Readings from Last 3 Encounters:   06/25/24 (!) 147.4 kg (325 lb)   06/10/24 (!) 148.1 kg (326 lb 6.4 oz)   05/20/24 (!) 144.2 kg (318 lb)    Body mass index is 49.42 kg/m.     BP HR SaO2   BP Readings from Last 3 Encounters:   06/25/24 (!) 158/96   06/10/24 137/82   05/20/24 (!) 144/99    Pulse Readings from Last 3 Encounters:   06/25/24 100   06/10/24 81   05/20/24 95    SpO2 Readings from Last 3 Encounters:   06/25/24 99%   06/10/24 98%   05/20/24 97%        Physical Exam  Constitutional:       Appearance: Normal appearance. She is obese.   HENT:      Head: Normocephalic and atraumatic.   Eyes:      Extraocular Movements: Extraocular movements intact.      Conjunctiva/sclera: Conjunctivae normal.      Pupils: Pupils are equal, round, and reactive to light.   Cardiovascular:      Rate and Rhythm: Tachycardia present.   Pulmonary:      Effort: Pulmonary effort is normal.   Neurological:      General: No focal deficit present.      Mental Status: She is alert and oriented to person, place, and time.   Psychiatric:         Mood and Affect: Mood normal.         Behavior: Behavior normal.         The mandibular molar Class :   [x] 1 [] 2 [] 3  (from initial exam)        Mallampati I Airway Classification:   [] 1 [] 2 [x] 3 [] 4 tonsils absent      Data reviewed:    Hemoglobin A1C   Date Value Ref Range Status   09/05/2023 4.6 4.2 - 5.6 % Final     Comment:     (NOTE)  HbA1C Interpretive Ranges  <5.7              Normal  5.7 - 6.4         Consider  Prediabetes  >6.5              Consider Diabetes          Lab Results   Component Value Date/Time    NA 136 09/05/2023 10:44 AM    K 4.5 09/05/2023 10:44 AM    CL 104 09/05/2023 10:44 AM    CO2 27 09/05/2023 10:44 AM    BUN 12 09/05/2023 10:44 AM    CREATININE 0.68 09/05/2023 10:44 AM    GLUCOSE 79 09/05/2023 10:44 AM    CALCIUM 9.5 09/05/2023 10:44 AM    LABGLOM >90 09/05/2023 10:44 AM    LABGLOM >60 02/28/2022 09:23 AM       Lab Results  Component Value Date    TSH 2.24 09/05/2023     Historical Sleep Testing Data:   Watch pat study 10/16/23  Diagnosis: Obstructive Sleep Apnea - Mild - AHI 8.2 (3%). REM AHI 27.5. O2 nadir - 85%. Time below 88% - 0.4 minutes. Total sleep time - 8:26.    PSG 01/30/24  Sleep latency 7 minutes  CPAP at 5-15 cwp used during study  Sleep efficiency 96%  Arousal index 4.1  N3 21%  AHI 2.6  PLMI 0  Mean HR 55    MSLT  MSL 9.9 minutes  No SOREMS  UDS clear    Positive Airway Pressure (PAP) Compliance  Report & Summary Trends  DME: [employer]  11/10/23  Date >4 hr use % Time  (Total Time%) AHI PAP Rx Pressure @ 95% Median Use Time Leak-Median  (95%) Notes   11/10/23-01/07/24 95% 0.5 5-15, EPR 1 7.1 7:57 0.3    01/08/24-02/19/24 79% 0.5 5-15, EPR 1 7.3 8:13 0.0                           The patient (or guardian, if applicable) and other individuals in attendance with the patient were advised that Artificial Intelligence will be utilized during this visit to record, process the conversation to generate a clinical note, and support improvement of the AI technology. The patient (or guardian, if applicable) and other individuals in attendance at the appointment consented to the use of AI, including the recording.      Total time spent on this encounter, including previsit review of of separately obtained history, face to face interaction, performing medically appropriate physical exam, patient/counseling, ordering tests, care coordination and documentation was  30  minutes.     Electronically signed  by Nat Legions, DO on2/08/2024 at 2:45 PM

## 2024-06-25 NOTE — Patient Instructions (Signed)
"  Please make a follow up appointment to discuss the results of your sleep study or I will send you a my chart message with your results and treatment options.     The [facility] is located in the [facility], adjacent to [facility]. The lab is on the second floor. The direct number to call for sleep study related questions is: [phone].    Please call our clinic back at [phone] or send a message on MyChart if you have any questions or concerns or if you are experiencing any of the following:     You have not received a follow up appointment within 30 days prior the recommended follow up time.    If you are not tolerating treatment plan and/or not able to obtain equipment or prescribed medication(s).  if you are experiencing any difficulties with the Durable Medical Equipment  (DME) Company you may be using or is assigned to you.  Two weeks have passed and you have not received an appointment for a scheduled procedure.  Two weeks have passed since you underwent a test and/or procedure and you have not received your results.  Your  Durable Medical Equipment (DME ) company is supposed to provide you with replacement filters, tubing and masks. You can either call your DME when you need new supplies or you can arrange for an automatic shipment schedule.    Your need to be seen by our office at lat minimum of every 12 months in order to renew the prescription for these supplies.   Please make note of who your DME company is and their phone number.   Please make sure that you clean your mask and hosing on a regular basis.  Your DME can provide you with additional information regarding proper care and cleaning of your device    Driver Safety is strongly encouraged.  You should not drive if sleepy, tired, distracted and/or fatigued.      Chest Xrays and blood work do not require appointments.  They are considered Walk-In services and can be obtained at either [facility] or [facility].  Blood work is performed at the Laboratory (Lab) from 08:00am - 04:00 pm (if applicable to your visit)   "

## 2024-06-25 NOTE — Progress Notes (Signed)
 Melanie Harvey presents today for   Chief Complaint   Patient presents with    Follow-up     CPAP & Excessive daytime sleepiness.       Is someone accompanying this pt? No.     Is the patient using any DME equipment during OV? No.     -DME Company: [employer].     Depression Screening:      06/25/2024     2:01 PM   PHQ-9    Little interest or pleasure in doing things 0   Feeling down, depressed, or hopeless 0   PHQ-2 Score 0   PHQ-9 Total Score 0        Epworth Sleepiness Scale:      06/19/2024     6:49 PM   Sleep Medicine   Sitting and reading 3   Watching TV 2   Sitting, inactive in a public place (e.g. a theatre or a meeting) 2   As a passenger in a car for an hour without a break 3   Lying down to rest in the afternoon when circumstances permit 3   Sitting and talking to someone 1   Sitting quietly after a lunch without alcohol 2   In a car, while stopped for a few minutes in traffic 2   Epworth Sleepiness Score 18        Patient-reported       Stop-Bang:      09/19/2023     9:00 AM   STOP-BANG QUESTIONNAIRE   Are you a loud and/or regular snorer? 1   Do you often feel tired or groggy upon awakening or do you awaken with a headache? 1   Have you been observed to gasp or stop breathing during sleep? 0   Are you often tired or fatigued during wake time hours?  0   Do you fall asleep sitting, reading, watching TV or driving? 0   Do you often have problems with memory or concentration? 0   Do you have or are you being treated for high blood pressure? 0   Recent BMI (Calculated) 0   Age older than 25 years old? 0=No   Is your neck circumference greater than 17 inches (Female) or 16 inches (Female)? 0   Gender - Female 0=No        Data saved with a previous flowsheet row definition           Coordination of Care:  1. Have you been to the ER, urgent care clinic since your last visit? Hospitalized since your last visit?  Yes.     Medication list has been updated according to patient.

## 2024-06-30 ENCOUNTER — Encounter: Payer: BLUE CROSS/BLUE SHIELD | Attending: Family | Primary: Family

## 2024-07-03 ENCOUNTER — Encounter: Payer: BLUE CROSS/BLUE SHIELD | Primary: Family

## 2024-07-11 ENCOUNTER — Encounter: Payer: BLUE CROSS/BLUE SHIELD | Primary: Family
